# Patient Record
Sex: Female | Born: 1945 | Race: White | Hispanic: No | Marital: Married | Smoking: Never smoker
Health system: Southern US, Community
[De-identification: ages and names within clinical notes are randomized; demographics above are authoritative.]

## PROBLEM LIST (undated history)

## (undated) DIAGNOSIS — M1990 Unspecified osteoarthritis, unspecified site: Secondary | ICD-10-CM

## (undated) DIAGNOSIS — I639 Cerebral infarction, unspecified: Secondary | ICD-10-CM

## (undated) DIAGNOSIS — E7800 Pure hypercholesterolemia, unspecified: Secondary | ICD-10-CM

## (undated) DIAGNOSIS — G459 Transient cerebral ischemic attack, unspecified: Secondary | ICD-10-CM

## (undated) DIAGNOSIS — I00 Rheumatic fever without heart involvement: Secondary | ICD-10-CM

## (undated) HISTORY — PX: OTHER SURGICAL HISTORY: SHX169

## (undated) HISTORY — PX: ABDOMINAL HYSTERECTOMY: SHX81

---

## 2011-06-18 ENCOUNTER — Encounter (HOSPITAL_COMMUNITY): Payer: Self-pay | Admitting: *Deleted

## 2011-06-18 ENCOUNTER — Emergency Department (HOSPITAL_COMMUNITY)
Admission: EM | Admit: 2011-06-18 | Discharge: 2011-06-18 | Disposition: A | Discharge status: Home / Self Care | Payer: Medicare FFS | Source: Home / Self Care | Attending: Emergency Medicine | Admitting: Emergency Medicine

## 2011-06-18 DIAGNOSIS — G43509 Persistent migraine aura without cerebral infarction, not intractable, without status migrainosus: Secondary | ICD-10-CM

## 2011-06-18 HISTORY — DX: Transient cerebral ischemic attack, unspecified: G45.9

## 2011-06-18 HISTORY — DX: Unspecified osteoarthritis, unspecified site: M19.90

## 2011-06-18 HISTORY — DX: Pure hypercholesterolemia, unspecified: E78.00

## 2011-06-18 MED ORDER — HYDROCODONE-ACETAMINOPHEN 5-325 MG PO TABS
1.0000 | ORAL_TABLET | Freq: Once | ORAL | Status: AC
  Administered 2011-06-18: 1 via ORAL

## 2011-06-18 MED ORDER — ONDANSETRON 4 MG PO TBDP
ORAL_TABLET | ORAL | Status: AC
  Filled 2011-06-18: qty 1

## 2011-06-18 MED ORDER — HYDROCODONE-ACETAMINOPHEN 5-325 MG PO TABS
ORAL_TABLET | ORAL | Status: AC
  Filled 2011-06-18: qty 1

## 2011-06-18 MED ORDER — ONDANSETRON HCL 4 MG PO TABS: 4.0000 mg | ORAL_TABLET | Freq: Three times a day (TID) | ORAL | Status: DC | PRN

## 2011-06-18 MED ORDER — ONDANSETRON 4 MG PO TBDP
4.0000 mg | ORAL_TABLET | Freq: Once | ORAL | Status: AC
  Administered 2011-06-18: 4 mg via ORAL

## 2011-06-18 MED ORDER — HYDROCODONE-IBUPROFEN 7.5-200 MG PO TABS: 1.0000 | ORAL_TABLET | Freq: Four times a day (QID) | ORAL | Status: DC | PRN

## 2011-06-18 NOTE — ED Provider Notes (Addendum)
History     CSN: [id number]  Arrival date & time 06/18/11  1207   First MD Initiated Contact with Patient 06/18/11 1218      Chief Complaint  Patient presents with  . Migraine    (Consider location/radiation/quality/duration/timing/severity/associated sxs/prior treatment) HPI Comments: Patient presents to urgent care today she has been experiencing a migraine headache since Thursday. Describes has been having this "abnormal  eye sensation"  she tends to have visual auras " like an aluminum shaped structure on her right eye". She has tried ibuprofen for her pain. Patient describes episodes of migraine headaches usually don't last this long  photophobia and mild nausea. One episode of vomitting . Patient denies any further symptoms such as numbness, tingling, muscular weakness of any lower or upper extremities. No speech pattern changes or difficulties, no facial paralysis,. Spouse present during room did not comment on any further symptomatology as well or any changes.  Patient describes that at this point her headache seems to be getting better still has this unusual right eye sensation. She seems comfortable as she is describing history of her symptoms.  During exam and interview ask her if she has seen a neurologist in the past as she has had migraine headaches with other accompanied symptoms that are not typical. She answered "no".  Of note during the last segment of my interaction , Im informed  by a staff member says she is the mother of a nurse that works in our center.. Which she offered [place] more information as the patient has been having this migraine headaches more frequently and that as of 2 months ago she had an MRI of her brain done by her PCP Octavio Manns)  Patient is a 66 y.o. female presenting with migraine. The history is provided by the patient and the spouse.  Migraine This is a new problem. The current episode started more than 2 days ago. The problem occurs daily. The problem  has been gradually improving. Associated symptoms include headaches. Pertinent negatives include no chest pain and no shortness of breath. The symptoms are relieved by NSAIDs. The treatment provided mild relief.    Past Medical History  Diagnosis Date  . Migraine with aura   . Hypercholesteremia   . Arthritis   . TIA (transient ischemic attack)     Past Surgical History  Procedure Date  . Abdominal hysterectomy   . Bladder tacking     No family history on file.  History  Substance Use Topics  . Smoking status: Never Smoker   . Smokeless tobacco: Not on file  . Alcohol Use: No    OB History    Grav Para Term Preterm Abortions TAB SAB Ect Mult Living                  Review of Systems  Constitutional: Positive for activity change. Negative for fever, fatigue and unexpected weight change.  HENT: Negative for congestion, rhinorrhea, neck pain and neck stiffness.   Eyes: Positive for photophobia and visual disturbance. Negative for pain, discharge and itching.  Respiratory: Negative for shortness of breath.   Cardiovascular: Negative for chest pain and palpitations.  Neurological: Positive for headaches. Negative for dizziness, tremors, seizures, syncope, facial asymmetry, speech difficulty, weakness and numbness.    Allergies  Review of patient's allergies indicates no known allergies.  Home Medications   Current Outpatient Rx  Name Route Sig Dispense Refill  . ATORVASTATIN CALCIUM 20 MG PO TABS Oral Take 20 mg by mouth daily.    Marland Kitchen  MELOXICAM 15 MG PO TABS Oral Take 15 mg by mouth daily.    Marland Kitchen OMEPRAZOLE 20 MG PO CPDR Oral Take 20 mg by mouth daily.    Marland Kitchen HYDROCODONE-IBUPROFEN 7.5-200 MG PO TABS Oral Take 1 tablet by mouth every 6 (six) hours as needed for pain. 15 tablet 0  . ONDANSETRON HCL 4 MG PO TABS Oral Take 1 tablet (4 mg total) by mouth every 8 (eight) hours as needed for nausea. 15 tablet 0    BP 190/97  Pulse 67  Temp(Src) 98.4 F (36.9 C) (Oral)  Resp  17  SpO2 100%  Physical Exam  Nursing note and vitals reviewed. Constitutional: She is oriented to person, place, and time. She appears well-developed and well-nourished.  Non-toxic appearance. She does not have a sickly appearance. She does not appear ill. No distress.  HENT:  Head: Normocephalic.  Mouth/Throat: No oropharyngeal exudate.  Eyes: Conjunctivae and EOM are normal. Pupils are equal, round, and reactive to light. Right eye exhibits no chemosis and no discharge. Left eye exhibits no chemosis and no discharge. No scleral icterus.  Fundoscopic exam:      The right eye shows no hemorrhage.       The left eye shows no hemorrhage.    Neck: Neck supple. No JVD present.  Cardiovascular: Normal rate and regular rhythm.   Pulmonary/Chest: Effort normal. No respiratory distress.  Musculoskeletal: She exhibits no tenderness.  Lymphadenopathy:    She has no cervical adenopathy.  Neurological: She is alert and oriented to person, place, and time. She displays normal reflexes. No cranial nerve deficit. She exhibits normal muscle tone. Coordination normal.  Skin: No rash noted. No erythema.    ED Course  Procedures (including critical care time)  Labs Reviewed - No data to display No results found.   1. Migraine aura, persistent       MDM   Today's neurological exam was unremarkable on gross eye exam revealed no abnormalities as well. Suspect patient has complex type migraine with further information provided by relatively noted the patient is undergoing evaluation as her migraine headaches have become more frequent. Symptomatic management encouraged him to followup with ophthalmologist for a more comprehensive eye exam to rule out retinal conditions. Patient also was instructed about symptoms that should require further evaluation in the emergency department. Patient agreed with plan of care and followup care.       Jimmie Molly, MD 06/18/11 1737  Jimmie Molly, MD 06/20/11  [phone]

## 2011-06-18 NOTE — ED Notes (Addendum)
C/O migraine since Thurs.  Feels like typical migraine, but the duration is longer than usual.  Has tried 800mg  IBU without relief.  Has had intermittent nausea and sensitivity to light - none @ present time.  Typically has a visual aura preceding migraines "like crinkled aluminum foil in the shape of a letter C", but this time aura in right eye has remained and is a different shape than usual.  Feels her migraines have been occuring with increased frequency.  Had normal MRI approx 2 mo ago per daughter.

## 2011-06-20 ENCOUNTER — Encounter (HOSPITAL_COMMUNITY): Payer: Self-pay | Admitting: *Deleted

## 2011-06-20 ENCOUNTER — Emergency Department (HOSPITAL_COMMUNITY)
Admission: EM | Admit: 2011-06-20 | Discharge: 2011-06-20 | Disposition: A | Discharge status: Home / Self Care | Payer: Medicare FFS | Attending: Emergency Medicine | Admitting: Emergency Medicine

## 2011-06-20 DIAGNOSIS — R42 Dizziness and giddiness: Secondary | ICD-10-CM | POA: Insufficient documentation

## 2011-06-20 DIAGNOSIS — Z8673 Personal history of transient ischemic attack (TIA), and cerebral infarction without residual deficits: Secondary | ICD-10-CM | POA: Insufficient documentation

## 2011-06-20 DIAGNOSIS — G43909 Migraine, unspecified, not intractable, without status migrainosus: Secondary | ICD-10-CM | POA: Insufficient documentation

## 2011-06-20 DIAGNOSIS — E7800 Pure hypercholesterolemia, unspecified: Secondary | ICD-10-CM | POA: Insufficient documentation

## 2011-06-20 MED ORDER — METOCLOPRAMIDE HCL 5 MG/ML IJ SOLN
10.0000 mg | Freq: Once | INTRAMUSCULAR | Status: AC
  Administered 2011-06-20: 10 mg via INTRAVENOUS
  Filled 2011-06-20: qty 2

## 2011-06-20 MED ORDER — SODIUM CHLORIDE 0.9 % IV BOLUS (SEPSIS)
1000.0000 mL | Freq: Once | INTRAVENOUS | Status: AC
  Administered 2011-06-20: 1000 mL via INTRAVENOUS

## 2011-06-20 MED ORDER — DIPHENHYDRAMINE HCL 50 MG/ML IJ SOLN
12.5000 mg | Freq: Once | INTRAMUSCULAR | Status: AC
  Administered 2011-06-20: 12.5 mg via INTRAVENOUS
  Filled 2011-06-20: qty 1

## 2011-06-20 MED ORDER — DEXAMETHASONE 6 MG PO TABS
10.0000 mg | ORAL_TABLET | Freq: Once | ORAL | Status: AC
  Administered 2011-06-20: 10 mg via ORAL
  Filled 2011-06-20 (×2): qty 1

## 2011-06-20 MED ORDER — PROMETHAZINE HCL 25 MG/ML IJ SOLN
25.0000 mg | Freq: Once | INTRAMUSCULAR | Status: AC
  Administered 2011-06-20: 25 mg via INTRAVENOUS
  Filled 2011-06-20: qty 1

## 2011-06-20 NOTE — ED Provider Notes (Signed)
History     CSN: [id number]  Arrival date & time 06/20/11  1348   First MD Initiated Contact with Patient 06/20/11 1510      Chief Complaint  Patient presents with  . Migraine    (Consider location/radiation/quality/duration/timing/severity/associated sxs/prior treatment) Patient is a 66 y.o. female presenting with migraine. The history is provided by the patient.  Migraine This is a recurrent problem. Episode onset: 4 days ago. The problem occurs constantly. The problem has been waxing and waning. Associated symptoms include headaches, nausea and vomiting (once). Pertinent negatives include no abdominal pain, chest pain, chills, congestion, fever, rash or weakness. Exacerbated by: light, loud noises. Treatments tried: lortab. The treatment provided mild relief.    Past Medical History  Diagnosis Date  . Migraine with aura   . Hypercholesteremia   . Arthritis   . TIA (transient ischemic attack)     Past Surgical History  Procedure Date  . Abdominal hysterectomy   . Bladder tacking     History reviewed. No pertinent family history.  History  Substance Use Topics  . Smoking status: Never Smoker   . Smokeless tobacco: Not on file  . Alcohol Use: No    OB History    Grav Para Term Preterm Abortions TAB SAB Ect Mult Living                  Review of Systems  Constitutional: Negative for fever and chills.  HENT: Negative for congestion and rhinorrhea.   Respiratory: Negative for shortness of breath.   Cardiovascular: Negative for chest pain and leg swelling.  Gastrointestinal: Positive for nausea and vomiting (once). Negative for abdominal pain and constipation.  Genitourinary: Negative for urgency, decreased urine volume and difficulty urinating.  Skin: Negative for rash and wound.  Neurological: Positive for light-headedness and headaches. Negative for dizziness, syncope and weakness.  Psychiatric/Behavioral: Negative for confusion.  All other systems reviewed  and are negative.    Allergies  Review of patient's allergies indicates no known allergies.  Home Medications   Current Outpatient Rx  Name Route Sig Dispense Refill  . ATORVASTATIN CALCIUM 20 MG PO TABS Oral Take 20 mg by mouth daily.    Marland Kitchen HYDROCODONE-IBUPROFEN 7.5-200 MG PO TABS Oral Take 1 tablet by mouth every 6 (six) hours as needed. For pain    . MELOXICAM 15 MG PO TABS Oral Take 15 mg by mouth daily.    Marland Kitchen OMEPRAZOLE 20 MG PO CPDR Oral Take 20 mg by mouth daily.    Marland Kitchen ONDANSETRON HCL 4 MG PO TABS Oral Take 4 mg by mouth every 8 (eight) hours as needed. For nausea      BP 175/76  Pulse 63  Temp(Src) 97.6 F (36.4 C) (Oral)  Resp 20  SpO2 96%  Physical Exam  Nursing note and vitals reviewed. Constitutional: She is oriented to person, place, and time. She appears well-developed and well-nourished. No distress.  HENT:  Head: Normocephalic and atraumatic.  Right Ear: External ear normal.  Left Ear: External ear normal.  Nose: Nose normal.  Mouth/Throat: Oropharynx is clear and moist.  Eyes: EOM are normal. Pupils are equal, round, and reactive to light.  Neck: Neck supple.  Cardiovascular: Normal rate, regular rhythm, normal heart sounds and intact distal pulses.   Pulmonary/Chest: Effort normal and breath sounds normal. No respiratory distress. She has no wheezes. She has no rales.  Abdominal: Soft. She exhibits no distension. There is no tenderness.  Musculoskeletal: She exhibits no edema.  Lymphadenopathy:  She has no cervical adenopathy.  Neurological: She is alert and oriented to person, place, and time. She has normal strength. No cranial nerve deficit or sensory deficit. She exhibits normal muscle tone. Coordination and gait normal. GCS eye subscore is 4. GCS verbal subscore is 5. GCS motor subscore is 6.  Reflex Scores:      Tricep reflexes are 2+ on the right side and 2+ on the left side.      Bicep reflexes are 2+ on the right side and 2+ on the left side.       Brachioradialis reflexes are 2+ on the right side and 2+ on the left side.      Patellar reflexes are 2+ on the right side and 2+ on the left side.      Achilles reflexes are 2+ on the right side and 2+ on the left side. Skin: Skin is warm and dry. She is not diaphoretic. No pallor.    ED Course  Procedures (including critical care time)    1. Migraine headache       MDM  66 yo female with 4 days of migraine with aura (seeing aluminum foil out of right eye). Appears well, pain 6/10 on arrival. No atypical symptoms at this time to suggest imaging. Treated headache with reglan, benadryl, decadron and fluids (decreased po intake due to nausea). Pain improved but not enough, so also given phenergan. Pain then resolved completely, and patient felt ok to go home. With no new or different symptoms than her baseline migraines, do not feel imaging is indicated. No fevers or nuchal rigidity to suggest infectious cause. Discussed return precautions.         Pricilla Loveless, MD 06/21/11 [phone]

## 2011-06-20 NOTE — ED Notes (Signed)
Pt has had a migraine headache and nausea for a week now and hasn't been eating very well because of the nausea. Husband is with patient for support

## 2011-06-20 NOTE — ED Provider Notes (Addendum)
I saw and evaluated the patient, reviewed the resident's note and I agree with the findings and plan. 66 year old, female, with known history of migraine headaches complains of left-sided headache, with nausea, no vomiting.  She got photophobia, and scotomata in her left visual field.  She denies fever, neck pain, weakness, or rash.  On physical examination.  She is alert and oriented in no distress.  She got a normal neurological examination.  There is no indication for a CAT scan or lumbar puncture.  There is no indication for laboratory testing, either.  Cheri Guppy, MD 06/20/11 1757  I saw and evaluated the patient, reviewed the resident's note and I agree with the findings and plan.  Cheri Guppy, MD 06/21/11 [phone]

## 2011-06-20 NOTE — Discharge Instructions (Signed)
We discussed several things. Your  migraine seem to have a persistent aura. Have recommended you have a comprehensive funduscopic exam to rule out other ophthalmological conditions. I suspect your current symptoms are related to a complex migraine. Take these medicines  as prescribed and be alert for new symptoms. If worsening headache, any numbness, tingling or sudden weakness or facial changes should go immediately to the closest emergency department for further evaluation. Have also encouraged you to discuss a referral from your primary care Dr.  to see a neurologist as you are feeling the symptoms have becomed more frequent headaches.    Migraine Headache A migraine headache is an intense, throbbing pain on one or both sides of your head. The exact cause of a migraine headache is not always known. A migraine may be caused when nerves in the brain become irritated and release chemicals that cause swelling within blood vessels, causing pain. Many migraine sufferers have a family history of migraines. Before you get a migraine you may or may not get an aura. An aura is a group of symptoms that can predict the beginning of a migraine. An aura may include:  Visual changes such as:   Flashing lights.   Bright spots or zig-zag lines.   Tunnel vision.   Feelings of numbness.   Trouble talking.   Muscle weakness.  SYMPTOMS  Pain on one or both sides of your head.   Pain that is pulsating or throbbing in nature.   Pain that is severe enough to prevent daily activities.   Pain that is aggravated by any daily physical activity.   Nausea (feeling sick to your stomach), vomiting, or both.   Pain with exposure to bright lights, loud noises, or activity.   General sensitivity to bright lights or loud noises.  MIGRAINE TRIGGERS Examples of triggers of migraine headaches include:   Alcohol.   Smoking.   Stress.   It may be related to menses (female menstruation).   Aged cheeses.    Foods or drinks that contain nitrates, glutamate, aspartame, or tyramine.   Lack of sleep.   Chocolate.   Caffeine.   Hunger.   Medications such as nitroglycerine (used to treat chest pain), birth control pills, estrogen, and some blood pressure medications.  DIAGNOSIS  A migraine headache is often diagnosed based on:  Symptoms.   Physical examination.   A computerized X-ray scan (computed tomography, CT) of your head.  TREATMENT  Medications can help prevent migraines if they are recurrent or should they become recurrent. Your caregiver can help you with a medication or treatment program that will be helpful to you.   Lying down in a dark, quiet room may be helpful.   Keeping a headache diary may help you find a trend as to what may be triggering your headaches.  SEEK IMMEDIATE MEDICAL CARE IF:   You have confusion, personality changes or seizures.   You have headaches that wake you from sleep.   You have an increased frequency in your headaches.   You have a stiff neck.   You have a loss of vision.   You have muscle weakness.   You start losing your balance or have trouble walking.   You feel faint or pass out.  MAKE SURE YOU:   Understand these instructions.   Will watch your condition.   Will get help right away if you are not doing well or get worse.  Document Released: 02/28/2005 Document Revised: 02/17/2011 Document Reviewed: 10/14/2008 [employer]  Patient Information 2012 [employer]. 

## 2011-06-20 NOTE — ED Notes (Signed)
MD back at bedside.

## 2011-06-20 NOTE — Discharge Instructions (Signed)
Migraine Headache A migraine headache is an intense, throbbing pain on one or both sides of your head. The exact cause of a migraine headache is not always known. A migraine may be caused when nerves in the brain become irritated and release chemicals that cause swelling within blood vessels, causing pain. Many migraine sufferers have a family history of migraines. Before you get a migraine you may or may not get an aura. An aura is a group of symptoms that can predict the beginning of a migraine. An aura may include:  Visual changes such as:   Flashing lights.   Bright spots or zig-zag lines.   Tunnel vision.   Feelings of numbness.   Trouble talking.   Muscle weakness.  SYMPTOMS  Pain on one or both sides of your head.   Pain that is pulsating or throbbing in nature.   Pain that is severe enough to prevent daily activities.   Pain that is aggravated by any daily physical activity.   Nausea (feeling sick to your stomach), vomiting, or both.   Pain with exposure to bright lights, loud noises, or activity.   General sensitivity to bright lights or loud noises.  MIGRAINE TRIGGERS Examples of triggers of migraine headaches include:   Alcohol.   Smoking.   Stress.   It may be related to menses (female menstruation).   Aged cheeses.   Foods or drinks that contain nitrates, glutamate, aspartame, or tyramine.   Lack of sleep.   Chocolate.   Caffeine.   Hunger.   Medications such as nitroglycerine (used to treat chest pain), birth control pills, estrogen, and some blood pressure medications.  DIAGNOSIS  A migraine headache is often diagnosed based on:  Symptoms.   Physical examination.   A computerized X-ray scan (computed tomography, CT) of your head.  TREATMENT  Medications can help prevent migraines if they are recurrent or should they become recurrent. Your caregiver can help you with a medication or treatment program that will be helpful to you.   Lying  down in a dark, quiet room may be helpful.   Keeping a headache diary may help you find a trend as to what may be triggering your headaches.  SEEK IMMEDIATE MEDICAL CARE IF:   You have confusion, personality changes or seizures.   You have headaches that wake you from sleep.   You have an increased frequency in your headaches.   You have a stiff neck.   You have a loss of vision.   You have muscle weakness.   You start losing your balance or have trouble walking.   You feel faint or pass out.  MAKE SURE YOU:   Understand these instructions.   Will watch your condition.   Will get help right away if you are not doing well or get worse.  Document Released: 02/28/2005 Document Revised: 02/17/2011 Document Reviewed: 10/14/2008 [facility] Patient Information 2012 [place], [place].

## 2011-06-20 NOTE — ED Notes (Signed)
To ed for further eval of continued migraine HA. Seen last week for same at [facility]. Pain has continued. Pt c/o photophobia, weakness, and seeing spots. Nausea and vomiting this am.

## 2011-06-21 NOTE — ED Provider Notes (Signed)
I saw and evaluated the patient, reviewed the resident's note and I agree with the findings and plan.  Cheri Guppy, MD 06/21/11 [phone]

## 2011-07-01 ENCOUNTER — Inpatient Hospital Stay (HOSPITAL_COMMUNITY): Payer: Medicare FFS

## 2011-07-01 ENCOUNTER — Emergency Department (HOSPITAL_COMMUNITY): Payer: Medicare FFS

## 2011-07-01 ENCOUNTER — Inpatient Hospital Stay (HOSPITAL_COMMUNITY)
Admission: EM | Admit: 2011-07-01 | Discharge: 2011-07-02 | DRG: 066 | Disposition: A | Discharge status: Home / Self Care | Payer: Medicare FFS | Attending: Internal Medicine | Admitting: Internal Medicine

## 2011-07-01 ENCOUNTER — Encounter (HOSPITAL_COMMUNITY): Payer: Self-pay | Admitting: *Deleted

## 2011-07-01 DIAGNOSIS — R030 Elevated blood-pressure reading, without diagnosis of hypertension: Secondary | ICD-10-CM | POA: Diagnosis present

## 2011-07-01 DIAGNOSIS — R209 Unspecified disturbances of skin sensation: Secondary | ICD-10-CM | POA: Diagnosis present

## 2011-07-01 DIAGNOSIS — M129 Arthropathy, unspecified: Secondary | ICD-10-CM | POA: Diagnosis present

## 2011-07-01 DIAGNOSIS — Z79899 Other long term (current) drug therapy: Secondary | ICD-10-CM

## 2011-07-01 DIAGNOSIS — Z7982 Long term (current) use of aspirin: Secondary | ICD-10-CM

## 2011-07-01 DIAGNOSIS — R200 Anesthesia of skin: Secondary | ICD-10-CM | POA: Diagnosis present

## 2011-07-01 DIAGNOSIS — G43109 Migraine with aura, not intractable, without status migrainosus: Secondary | ICD-10-CM | POA: Diagnosis present

## 2011-07-01 DIAGNOSIS — I498 Other specified cardiac arrhythmias: Secondary | ICD-10-CM | POA: Diagnosis present

## 2011-07-01 DIAGNOSIS — I6330 Cerebral infarction due to thrombosis of unspecified cerebral artery: Principal | ICD-10-CM | POA: Diagnosis present

## 2011-07-01 DIAGNOSIS — I639 Cerebral infarction, unspecified: Secondary | ICD-10-CM | POA: Diagnosis present

## 2011-07-01 DIAGNOSIS — G43909 Migraine, unspecified, not intractable, without status migrainosus: Secondary | ICD-10-CM | POA: Diagnosis present

## 2011-07-01 DIAGNOSIS — E785 Hyperlipidemia, unspecified: Secondary | ICD-10-CM | POA: Diagnosis present

## 2011-07-01 DIAGNOSIS — Z8673 Personal history of transient ischemic attack (TIA), and cerebral infarction without residual deficits: Secondary | ICD-10-CM

## 2011-07-01 DIAGNOSIS — R001 Bradycardia, unspecified: Secondary | ICD-10-CM | POA: Diagnosis not present

## 2011-07-01 HISTORY — DX: Rheumatic fever without heart involvement: I00

## 2011-07-01 LAB — BASIC METABOLIC PANEL
CO2: 27 mEq/L (ref 19–32)
Chloride: 104 mEq/L (ref 96–112)
Glucose, Bld: 111 mg/dL — ABNORMAL HIGH (ref 70–99)
Potassium: 3.6 mEq/L (ref 3.5–5.1)
Sodium: 142 mEq/L (ref 135–145)

## 2011-07-01 LAB — CBC
HCT: 43.4 % (ref 36.0–46.0)
Hemoglobin: 14.9 g/dL (ref 12.0–15.0)
MCH: 29.7 pg (ref 26.0–34.0)
MCHC: 34.3 g/dL (ref 30.0–36.0)
MCV: 86.6 fL (ref 78.0–100.0)
Platelets: 262 10*3/uL (ref 150–400)
RBC: 5.01 MIL/uL (ref 3.87–5.11)
RDW: 12.4 % (ref 11.5–15.5)
WBC: 6.4 10*3/uL (ref 4.0–10.5)

## 2011-07-01 LAB — DIFFERENTIAL
Lymphocytes Relative: 27 % (ref 12–46)
Lymphs Abs: 1.7 10*3/uL (ref 0.7–4.0)
Monocytes Relative: 5 % (ref 3–12)
Neutrophils Relative %: 66 % (ref 43–77)

## 2011-07-01 LAB — CARDIAC PANEL(CRET KIN+CKTOT+MB+TROPI): Troponin I: <0.3 ng/mL (ref <0.30)

## 2011-07-01 LAB — TSH: TSH: 3.454 u[IU]/mL (ref 0.350–4.500)

## 2011-07-01 LAB — T4, FREE: Free T4: 1.17 ng/dL (ref 0.80–1.80)

## 2011-07-01 MED ORDER — MELOXICAM 7.5 MG PO TABS
15.0000 mg | ORAL_TABLET | Freq: Every day | ORAL | Status: DC
  Filled 2011-07-01 (×3): qty 1

## 2011-07-01 MED ORDER — ONDANSETRON HCL 4 MG/2ML IJ SOLN: 4.0000 mg | Freq: Four times a day (QID) | INTRAMUSCULAR | Status: DC | PRN

## 2011-07-01 MED ORDER — ENOXAPARIN SODIUM 40 MG/0.4ML ~~LOC~~ SOLN
40.0000 mg | SUBCUTANEOUS | Status: DC
  Administered 2011-07-01: 40 mg via SUBCUTANEOUS
  Filled 2011-07-01: qty 0.4

## 2011-07-01 MED ORDER — OXYCODONE-ACETAMINOPHEN 5-325 MG PO TABS
1.0000 | ORAL_TABLET | Freq: Once | ORAL | Status: AC
  Administered 2011-07-01: 1 via ORAL
  Filled 2011-07-01: qty 1

## 2011-07-01 MED ORDER — ASPIRIN 81 MG PO CHEW
324.0000 mg | CHEWABLE_TABLET | Freq: Once | ORAL | Status: AC
  Administered 2011-07-01: 324 mg via ORAL
  Filled 2011-07-01: qty 4

## 2011-07-01 MED ORDER — FOLIC ACID 400 MCG PO TABS
400.0000 ug | ORAL_TABLET | Freq: Every day | ORAL | Status: DC
Start: 2011-07-01 — End: 2011-07-01

## 2011-07-01 MED ORDER — SODIUM CHLORIDE 0.9 % IJ SOLN
3.0000 mL | Freq: Two times a day (BID) | INTRAMUSCULAR | Status: DC
  Administered 2011-07-01: 3 mL via INTRAVENOUS

## 2011-07-01 MED ORDER — PANTOPRAZOLE SODIUM 40 MG PO TBEC
40.0000 mg | DELAYED_RELEASE_TABLET | Freq: Every day | ORAL | Status: DC
  Administered 2011-07-02: 40 mg via ORAL
  Filled 2011-07-01: qty 1

## 2011-07-01 MED ORDER — SENNOSIDES-DOCUSATE SODIUM 8.6-50 MG PO TABS: 1.0000 | ORAL_TABLET | Freq: Every evening | ORAL | Status: DC | PRN

## 2011-07-01 MED ORDER — ATORVASTATIN CALCIUM 20 MG PO TABS
20.0000 mg | ORAL_TABLET | Freq: Every day | ORAL | Status: DC
  Administered 2011-07-01: 20 mg via ORAL
  Filled 2011-07-01: qty 1

## 2011-07-01 MED ORDER — FOLIC ACID 1 MG PO TABS
1.0000 mg | ORAL_TABLET | Freq: Every day | ORAL | Status: DC
  Administered 2011-07-02: 1 mg via ORAL
  Filled 2011-07-01: qty 1

## 2011-07-01 MED ORDER — OMEGA-3-ACID ETHYL ESTERS 1 G PO CAPS
2.0000 g | ORAL_CAPSULE | Freq: Every day | ORAL | Status: DC
  Administered 2011-07-02: 2 g via ORAL
  Filled 2011-07-01: qty 2

## 2011-07-01 MED ORDER — OXYCODONE HCL 5 MG PO TABS: 5.0000 mg | ORAL_TABLET | ORAL | Status: DC | PRN

## 2011-07-01 MED ORDER — CLOPIDOGREL BISULFATE 75 MG PO TABS
75.0000 mg | ORAL_TABLET | Freq: Every day | ORAL | Status: DC
  Administered 2011-07-02: 75 mg via ORAL
  Filled 2011-07-01: qty 1

## 2011-07-01 MED ORDER — SIMVASTATIN 20 MG PO TABS: 40.0000 mg | ORAL_TABLET | Freq: Every day | ORAL | Status: DC

## 2011-07-01 MED ORDER — SODIUM CHLORIDE 0.9 % IJ SOLN
INTRAMUSCULAR | Status: AC
  Filled 2011-07-01: qty 3

## 2011-07-01 MED ORDER — LORAZEPAM 1 MG PO TABS
1.0000 mg | ORAL_TABLET | Freq: Once | ORAL | Status: AC
  Administered 2011-07-01: 1 mg via ORAL
  Filled 2011-07-01: qty 1

## 2011-07-01 NOTE — ED Provider Notes (Signed)
History   This chart was scribed for Lauren Gaskins, MD by Cherlynn Perches. The patient was seen in room APA14/APA14. Patient's care was started at 1121.      CSN: [id number]  Arrival date & time 07/01/11  1121   First MD Initiated Contact with Patient 07/01/11 1129      Chief Complaint  Patient presents with  . Code Stroke     HPI  Lauren Sanders is a 66 y.o. female with a h/o a TIA who presents to the Emergency Department complaining of one hour of sudden onset numbness localized to the left arm and mouth with associated weakness, mild confusion, and lightheadedness. Pt states that she has experienced relief from symptoms since arriving to the ED. Pt also reports that she has been suffering from moderate migraine headaches with aura for about 2 weeks. Pt is supposed to see Dr. Anne Hahn at [facility] in 4 days for an MRI. Pt reports that she had a TIA last November and had an MRI at the same time, but has not had any CT scans. Pt also reports that everything she eats or drinks has tasted bitter "for a few weeks." Pt denies cough, SOB, chest pain, abdominal pain, and syncope. Pt currently takes a low dose ASA daily, but does not take any other blood thinners. Pt denies smoking and alcohol use. Pt's PCP is in [place].  Nothing improves symptoms Nothing worsens symptom   Past Medical History  Diagnosis Date  . Migraine with aura   . Hypercholesteremia   . Arthritis   . TIA (transient ischemic attack)     Past Surgical History  Procedure Date  . Abdominal hysterectomy   . Bladder tacking     No family history on file.  History  Substance Use Topics  . Smoking status: Never Smoker   . Smokeless tobacco: Not on file  . Alcohol Use: No    OB History    Grav Para Term Preterm Abortions TAB SAB Ect Mult Living                  Review of Systems A complete 10 system review of systems was obtained and all systems are negative except as noted in the HPI and  PMH.    Allergies  Review of patient's allergies indicates no known allergies.  Home Medications   Current Outpatient Rx  Name Route Sig Dispense Refill  . ATORVASTATIN CALCIUM 20 MG PO TABS Oral Take 20 mg by mouth daily.    Marland Kitchen HYDROCODONE-IBUPROFEN 7.5-200 MG PO TABS Oral Take 1 tablet by mouth every 6 (six) hours as needed. For pain    . MELOXICAM 15 MG PO TABS Oral Take 15 mg by mouth daily.    Marland Kitchen OMEPRAZOLE 20 MG PO CPDR Oral Take 20 mg by mouth daily.    Marland Kitchen ONDANSETRON HCL 4 MG PO TABS Oral Take 4 mg by mouth every 8 (eight) hours as needed. For nausea      BP 174/83  Temp(Src) 98.1 F (36.7 C) (Oral)  Resp 18  Ht 5\' 2"  (1.575 m)  Wt 115 lb (52.164 kg)  BMI 21.03 kg/m2  SpO2 99% BP 174/83  Temp(Src) 98.1 F (36.7 C) (Oral)  Resp 18  Ht 5\' 2"  (1.575 m)  Wt 115 lb (52.164 kg)  BMI 21.03 kg/m2  SpO2 99%  Physical Exam CONSTITUTIONAL: Well developed/well nourished HEAD AND FACE: Normocephalic/atraumatic EYES: EOMI/PERRL ENMT: Mucous membranes moist NECK: supple no meningeal signs, no bruits  SPINE:entire spine nontender CV: S1/S2 noted, no murmurs/rubs/gallops noted LUNGS: Lungs are clear to auscultation bilaterally, no apparent distress ABDOMEN: soft, nontender, no rebound or guarding GU:no cva tenderness NEURO:  NIHSS:0, no nystagmus, no past pointing. Awake/alert, facies symmetric, no arm or leg drift is noted Cranial nerves 3/4/5/6/09/19/08/11/12 tested and intact No visual field deficit EXTREMITIES: pulses normal, full ROM SKIN: warm, color normal PSYCH: no abnormalities of mood noted   ED Course  Procedures  DIAGNOSTIC STUDIES: Oxygen Saturation is 99% on room air, normal by my interpretation.    COORDINATION OF CARE: 11:40AM - Will call Dr. Anne Hahn about doing MRI now. Patient and Family understand and agree with initial ED impression and plan with expectations set for ED visit.  tPA in stroke considered but not given due to: Symptoms resolved  1:39  PM Spoke to dr Anne Hahn, her neurologist Had outpatient mri ordered for likely complicated migraine This can be done today.  If negative, can be discharged with followup and continue daily ASA Does not feel she needs TIA workup after MRI  12:43PM - spoke to pt regarding plan for MRI 2:01 PM D/w radiology concerning MRI Will call her neurologist ASA ordered Pt stable, ambulatory Pt reports HA but no new weakness  2:14PM - consult with neurologist, recommend admit  2:47 PM D/w dr Sherrie Mustache, will admit here to [place] Pt updated   MDM  Nursing notes reviewed and considered in documentation All labs/vitals reviewed and considered Previous records reviewed and considered    Date: 07/01/2011  Rate: 73  Rhythm: normal sinus rhythm  QRS Axis: normal  Intervals: normal  ST/T Wave abnormalities: nonspecific ST changes  Conduction Disutrbances:none      I personally performed the services described in this documentation, which was scribed in my presence. The recorded information has been reviewed and considered.       Lauren Gaskins, MD 07/01/11 1447

## 2011-07-01 NOTE — ED Notes (Signed)
Pt presents to er with c/o sudden onset of left arm weakness and mouth feeling numb that occurred around 10:30 this am, on arrival to er pt alert, able to answer questions,  Grips equal bilateral, speech clear, pt states that she has been having problems with headaches for the past two weeks or so.

## 2011-07-01 NOTE — H&P (Signed)
Lauren Sanders MRN: [id number] DOB/AGE: 08-19-1945 66 y.o. Primary Care Physician:Pcp Not In System Admit date: 07/01/2011 Chief Complaint: Left-sided numbness. HPI: The patient is a 66 year old woman with a past medical history significant for migraine headaches with aura, previous TIA, and hyperlipidemia, who presents to the emergency department today with a complaint of left facial numbness and left arm numbness. The numbness started this morning at approximately 10:30 AM. She also had transient slurred speech according to her husband and son. She had an unsteady gait this morning. She currently denies unilateral weakness, but does have sensation of left-sided numbness still. She denies chest pain, palpitations, shortness of breath, vomiting, abdominal pain, pain with urination, swelling in her legs or feet, fever, or chills. She has also had both left-sided and right-sided migraine headaches over the past couple weeks that have worsened. She was seen at the [facility] urgent care and then the [facility] emergency department for treatment of migraine headaches that were associated with a right-sided aura, blurred vision, and nausea. She also had transient blindness in both eyes. She was treated with Zofran and hydrocodone. She was advised to see an ophthalmologist or optometrist. She did and following the visual examination, she was told that there were no abnormalities. She was subsequently referred to neurologist Dr. Anne Hahn. He prescribed a medication for migraine headaches which she took. It did help to relieve her headache but it caused some nausea. The plan was for Dr. Anne Hahn to schedule an outpatient MRI of her brain within the next week or 2 according to the family.  In the emergency department, the patient is noted to be hypertensive with a blood pressure ranging in the 160s to 180s systolically. She is otherwise afebrile and hemodynamically stable. An MRI of her brain was ordered in the emergency  department, and it revealed 2 small areas of infarction on the left. She is being admitted for further evaluation and management.  Past Medical History  Diagnosis Date  . Migraine with aura   . Hypercholesteremia   . Arthritis   . TIA (transient ischemic attack)   . Rheumatic fever     As a child.    Past Surgical History  Procedure Date  . Abdominal hysterectomy   . Bladder tacking     Prior to Admission medications   Medication Sig Start Date End Date Taking? Authorizing Provider  aspirin 81 MG tablet Take 81 mg by mouth daily.   Yes Historical Provider, MD  atorvastatin (LIPITOR) 20 MG tablet Take 20 mg by mouth daily.   Yes Historical Provider, MD  Diclofenac Potassium (CAMBIA) 50 MG PACK Take 1 each by mouth daily as needed. Pain   Yes Historical Provider, MD  fish oil-omega-3 fatty acids 1000 MG capsule Take 2 g by mouth daily.   Yes Historical Provider, MD  folic acid (FOLVITE) 400 MCG tablet Take 400 mcg by mouth daily.   Yes Historical Provider, MD  ibuprofen (ADVIL,MOTRIN) 200 MG tablet Take 400 mg by mouth every 6 (six) hours as needed. Pain   Yes Historical Provider, MD  meloxicam (MOBIC) 15 MG tablet Take 15 mg by mouth daily.   Yes Historical Provider, MD  omeprazole (PRILOSEC) 20 MG capsule Take 20 mg by mouth daily.   Yes Historical Provider, MD  ondansetron (ZOFRAN) 4 MG tablet Take 4 mg by mouth every 8 (eight) hours as needed. For nausea   Yes Jimmie Molly, MD  Vitamin D, Ergocalciferol, (DRISDOL) 50000 UNITS CAPS Take 50,000 Units by mouth  daily.   Yes Historical Provider, MD    Allergies: No Known Allergies  No family history on file.  Social History: She is retired. She is married. She has 3 children. She denies tobacco, alcohol, and illicit drug use.       ROS: As above in history present illness, otherwise review of systems is negative.  PHYSICAL EXAM: Blood pressure 164/83, pulse 65, temperature 98.2 F (36.8 C), temperature source Oral, resp.  rate 18, height 5\' 2"  (1.575 m), weight 52.164 kg (115 lb), SpO2 95.00%.  General: Very pleasant 66 year old Caucasian woman, laying in bed, in no acute distress. HEENT: Head is normocephalic, nontraumatic. Pupils are equal, round, and reactive to light. Extraocular moves are intact. Conjunctivae are clear. Sclerae are white. Oropharynx reveals moist mucous membranes. No posterior exudates or erythema.  Neck: Supple, no adenopathy, no thyromegaly, no JVD. There is a radiating murmur bilaterally. Lungs: Clear to auscultation bilaterally. Heart: S1, S2, with a 1-2/6 systolic murmur. No ectopy. Abdomen: Positive bowel sounds, soft, nontender, nondistended. Extremities: No pedal edema. Pedal pulses palpable. Neurologic: She is alert and oriented x3. Cranial nerve II through XII intact. Grossly, sensation is symmetric and intact bilaterally. No evidence of facial droop or dysarthria or aphasia. Negative pronator drift. Cerebellar with finger-to-nose intact bilaterally. Strength is 5 over 5 throughout and symmetric in the supine position. Plantar reflexes mildly upgoing bilaterally.  Basic Metabolic Panel:  Basename 07/01/11 1141  NA 142  K 3.6  CL 104  CO2 27  GLUCOSE 111*  BUN 10  CREATININE 1.01  CALCIUM 10.5  MG --  PHOS --   Liver Function Tests: No results found for this basename: AST:2,ALT:2,ALKPHOS:2,BILITOT:2,PROT:2,ALBUMIN:2 in the last 72 hours No results found for this basename: LIPASE:2,AMYLASE:2 in the last 72 hours No results found for this basename: AMMONIA:2 in the last 72 hours CBC:  Basename 07/01/11 1141  WBC 6.4  NEUTROABS 4.2  HGB 14.9  HCT 43.4  MCV 86.6  PLT 262   Cardiac Enzymes: No results found for this basename: CKTOTAL:3,CKMB:3,CKMBINDEX:3,TROPONINI:3 in the last 72 hours BNP: No results found for this basename: PROBNP:3 in the last 72 hours D-Dimer: No results found for this basename: DDIMER:2 in the last 72 hours CBG: No results found for this  basename: GLUCAP:6 in the last 72 hours Hemoglobin A1C: No results found for this basename: HGBA1C in the last 72 hours Fasting Lipid Panel: No results found for this basename: CHOL,HDL,LDLCALC,TRIG,CHOLHDL,LDLDIRECT in the last 72 hours Thyroid Function Tests: No results found for this basename: TSH,T4TOTAL,FREET4,T3FREE,THYROIDAB in the last 72 hours Anemia Panel: No results found for this basename: VITAMINB12,FOLATE,FERRITIN,TIBC,IRON,RETICCTPCT in the last 72 hours Coagulation: No results found for this basename: LABPROT:2,INR:2 in the last 72 hours Urine Drug Screen: Drugs of Abuse  No results found for this basename: labopia,  cocainscrnur,  labbenz,  amphetmu,  thcu,  labbarb    Alcohol Level: No results found for this basename: ETH:2 in the last 72 hours Urinalysis: No results found for this basename: COLORURINE:2,APPERANCEUR:2,LABSPEC:2,PHURINE:2,GLUCOSEU:2,HGBUR:2,BILIRUBINUR:2,KETONESUR:2,PROTEINUR:2,UROBILINOGEN:2,NITRITE:2,LEUKOCYTESUR:2 in the last 72 hours Misc. Labs:   EKG: Normal sinus rhythm. Heart rate 73 beats per minute. Nonspecific ST/T-wave abnormalities.  No results found for this or any previous visit (from the past 240 hour(s)).   Results for orders placed during the hospital encounter of 07/01/11 (from the past 48 hour(s))  CBC     Status: Normal   Collection Time   07/01/11 11:41 AM      Component Value Range Comment   WBC 6.4  4.0 - 10.5 (K/uL)    RBC 5.01  3.87 - 5.11 (MIL/uL)    Hemoglobin 14.9  12.0 - 15.0 (g/dL)    HCT 16.1  09.6 - 04.5 (%)    MCV 86.6  78.0 - 100.0 (fL)    MCH 29.7  26.0 - 34.0 (pg)    MCHC 34.3  30.0 - 36.0 (g/dL)    RDW 40.9  81.1 - 91.4 (%)    Platelets 262  150 - 400 (K/uL)   DIFFERENTIAL     Status: Normal   Collection Time   07/01/11 11:41 AM      Component Value Range Comment   Neutrophils Relative 66  43 - 77 (%)    Neutro Abs 4.2  1.7 - 7.7 (K/uL)    Lymphocytes Relative 27  12 - 46 (%)    Lymphs Abs 1.7  0.7 -  4.0 (K/uL)    Monocytes Relative 5  3 - 12 (%)    Monocytes Absolute 0.3  0.1 - 1.0 (K/uL)    Eosinophils Relative 2  0 - 5 (%)    Eosinophils Absolute 0.1  0.0 - 0.7 (K/uL)    Basophils Relative 1  0 - 1 (%)    Basophils Absolute 0.0  0.0 - 0.1 (K/uL)   BASIC METABOLIC PANEL     Status: Abnormal   Collection Time   07/01/11 11:41 AM      Component Value Range Comment   Sodium 142  135 - 145 (mEq/L)    Potassium 3.6  3.5 - 5.1 (mEq/L)    Chloride 104  96 - 112 (mEq/L)    CO2 27  19 - 32 (mEq/L)    Glucose, Bld 111 (*) 70 - 99 (mg/dL)    BUN 10  6 - 23 (mg/dL)    Creatinine, Ser 7.82  0.50 - 1.10 (mg/dL)    Calcium 95.6  8.4 - 10.5 (mg/dL)    GFR calc non Af Amer 56 (*) >90 (mL/min)    GFR calc Af Amer 65 (*) >90 (mL/min)     Mr Brain Wo Contrast  07/01/2011  *RADIOLOGY REPORT*  Clinical Data: Sudden onset numbness localized to left arm, now with slight associated weakness and confusion symptoms are now improved. History of headaches, clinically migraine.  MRI HEAD WITHOUT CONTRAST  Technique:  Multiplanar, multiecho pulse sequences of the brain and surrounding structures were obtained according to standard protocol without intravenous contrast.  Comparison: None.  Findings: There are two subcentimeter foci of restricted diffusion in the left hemisphere consistent with acute infarction.  A 5 mm focus in the left lateral thalamus ( series 3 image 10) could result in right-sided sensory symptoms.  Similarly, a 5 mm focus in the left parietal subcortical white matter ( series 3 image 14) lies directly under the post central sulcus. These appeared represent areas of acute infarction.  Complicated migraine would be an unlikely secondary consideration.  No large vessel cortical infarct is seen to suggest vasculitis.  There is no hemorrhage, mass lesion, hydrocephalus, or extra-axial fluid.  Mild atrophy is appropriate for the patient's age of 53.  There is mild to moderate chronic microvascular  ischemic change in the periventricular and subcortical white matter. No midline shift. Normal pituitary and cerebellar tonsils.  Upper cervical region unremarkable.  Major intracranial vascular structures patent. Negative orbits.  Chronic sinus disease.  No mastoid fluid.  IMPRESSION: There are two small areas of restricted diffusion, one in the left thalamus, the other  in the left parietal subcortical white matter which could explain the patient's recent right hemisensory symptoms.  These are likely to represent cerebrovascular disease rather than complicated migraine.  Mild atrophy and chronic microvascular ischemic change.  No hemorrhage or mass lesion.  Original Report Authenticated By: Elsie Stain, M.D.   US Carotid Duplex Bilateral  07/01/2011  *RADIOLOGY REPORT*  Clinical Data: Stroke, TIA, history hypertension  BILATERAL CAROTID DUPLEX ULTRASOUND  Technique: Wallace Cullens scale imaging, color Doppler and duplex ultrasound was performed of bilateral carotid and vertebral arteries in the neck.  Comparison:  None  Criteria:  Quantification of carotid stenosis is based on velocity parameters that correlate the residual internal carotid diameter with NASCET-based stenosis levels, using the diameter of the distal internal carotid lumen as the denominator for stenosis measurement.  The following velocity measurements were obtained:                   PEAK SYSTOLIC/END DIASTOLIC RIGHT ICA:                        77/26cm/sec CCA:                        75/20cm/sec SYSTOLIC ICA/CCA RATIO:     1.02 DIASTOLIC ICA/CCA RATIO:    1.28 ECA:                        85cm/sec  LEFT ICA:                        81/31cm/sec CCA:                        66/15cm/sec SYSTOLIC ICA/CCA RATIO:     1.23 DIASTOLIC ICA/CCA RATIO:    2.01 ECA:                        85cm/sec  Findings:  RIGHT CAROTID ARTERY: Intimal thickening right CCA and right carotid bulb, less at right ICA and right ECA.  Tortuous right ICA with associated turbulent flow on  color Doppler imaging and spectral broadening on waveform analysis.  No significant plaque formation identified.  No high velocity jets.  RIGHT VERTEBRAL ARTERY:  Patent, antegrade  LEFT CAROTID ARTERY: Mild intimal thickening left CCA and left carotid bulb.  Minimal noncalcified plaque left carotid bulb. Mildly tortuous left carotid system with associated turbulent flow on color Doppler imaging and spectral broadening on waveform analysis.  No high velocity jets.  LEFT VERTEBRAL ARTERY:  Patent, antegrade  IMPRESSION: Tortuous carotid systems. No evidence of significant plaque formation or hemodynamically significant stenosis.  Original Report Authenticated By: Lollie Marrow, M.D.    Impression:  Principal Problem:  *Left sided numbness Active Problems:  Stroke, acute, thrombotic  Elevated blood pressure  Migraine headache  Hyperlipidemia    1. Acute left brain stroke with left-sided numbness/paresthesias. This is likely an aspirin failure.  2. History of migraine headaches with aura.  3. Elevated blood pressure. She has no history of hypertension however, over the past several weeks, she has been treated with several NSAIDs which could be contributing to her elevated blood pressures.  4. History of hyperlipidemia.    Plan:  1. The patient was given 325 mg of aspirin in the emergency department. Since she had an acute stroke on aspirin therapy, it will be  discontinued in favor of Plavix. 2. A carotid Doppler has already been ordered and the results revealed no significant ICA stenosis. A 2-D echocardiogram was ordered, but apparently, he was not done yet. A decision will be made to either keep the patient in the hospital over the weekend until Monday for the 2-D echocardiogram or to try to arrange it as an outpatient. 3. For further evaluation, we'll order TSH, free T4, fasting lipid profile, hemoglobin A1c, etc. 4. We'll treat the patient's migraine headaches with as needed oxycodone.  We'll discontinue all NSAIDs with the exception of Mobic and Plavix. 5. PT/OT consults. 6. We'll monitor the patient's blood pressure. We'll start antihypertensive medication is her blood pressure is consistently elevated over the next 24-48 hours.      Lauren Sanders 07/01/2011, 6:16 PM

## 2011-07-02 DIAGNOSIS — R001 Bradycardia, unspecified: Secondary | ICD-10-CM | POA: Diagnosis not present

## 2011-07-02 LAB — COMPREHENSIVE METABOLIC PANEL
ALT: 11 U/L (ref 0–35)
Albumin: 3.7 g/dL (ref 3.5–5.2)
Alkaline Phosphatase: 66 U/L (ref 39–117)
BUN: 12 mg/dL (ref 6–23)
CO2: 27 mEq/L (ref 19–32)
GFR calc Af Amer: 58 mL/min — ABNORMAL LOW (ref >90)
Glucose, Bld: 97 mg/dL (ref 70–99)
Total Bilirubin: 0.9 mg/dL (ref 0.3–1.2)
Total Protein: 6.4 g/dL (ref 6.0–8.3)

## 2011-07-02 LAB — LIPID PANEL
Cholesterol: 139 mg/dL (ref 0–200)
HDL: 42 mg/dL (ref >39)
Total CHOL/HDL Ratio: 3.3 RATIO
Triglycerides: 151 mg/dL — ABNORMAL HIGH (ref <150)
VLDL: 30 mg/dL (ref 0–40)

## 2011-07-02 LAB — CARDIAC PANEL(CRET KIN+CKTOT+MB+TROPI)
CK, MB: 1.3 ng/mL (ref 0.3–4.0)
Relative Index: INVALID (ref 0.0–2.5)
Troponin I: <0.3 ng/mL (ref <0.30)
Troponin I: <0.3 ng/mL (ref <0.30)

## 2011-07-02 LAB — CBC
HCT: 39.8 % (ref 36.0–46.0)
MCHC: 33.7 g/dL (ref 30.0–36.0)
MCV: 88.2 fL (ref 78.0–100.0)
Platelets: 255 10*3/uL (ref 150–400)
RDW: 12.6 % (ref 11.5–15.5)
WBC: 6 10*3/uL (ref 4.0–10.5)

## 2011-07-02 MED ORDER — VITAMIN D (ERGOCALCIFEROL) 1.25 MG (50000 UNIT) PO CAPS: 50000.0000 [IU] | ORAL_CAPSULE | ORAL | Status: DC

## 2011-07-02 MED ORDER — CLOPIDOGREL BISULFATE 75 MG PO TABS: 75.0000 mg | ORAL_TABLET | Freq: Every day | ORAL | Status: AC

## 2011-07-02 MED ORDER — OXYCODONE-ACETAMINOPHEN 5-325 MG PO TABS: 1.0000 | ORAL_TABLET | ORAL | Status: AC | PRN

## 2011-07-02 NOTE — Discharge Summary (Signed)
Physician Discharge Summary  Lauren Sanders MRN: [id number] DOB/AGE: 68-May-1947 68 y.o.  PCP: Pcp Not In System   Admit date: 07/01/2011 Discharge date: 07/02/2011  Discharge Diagnoses:  1. Acute left brain stroke. Aspirin failure. 2. Left-sided paresthesias/numbness secondary to the acute stroke. Resolving. 3. Migraine headaches. 4. Elevated blood pressure on admission, resolved at the time of discharge. 5. Transient bradycardia. Thyroid function was within normal limits. 6. Hyperlipidemia. The patient's fasting lipid profile revealed total cholesterol 139, triglycerides 151, HDL cholesterol of 42, and LDL cholesterol 67.   Medication List  As of 07/02/2011 10:12 AM   STOP taking these medications         aspirin 81 MG tablet      ibuprofen 200 MG tablet         TAKE these medications         atorvastatin 20 MG tablet   Commonly known as: LIPITOR   Take 20 mg by mouth daily.      CAMBIA 50 MG Pack   Generic drug: Diclofenac Potassium   Take 1 each by mouth daily as needed. Pain      clopidogrel 75 MG tablet   Commonly known as: PLAVIX   Take 1 tablet (75 mg total) by mouth daily with breakfast.      fish oil-omega-3 fatty acids 1000 MG capsule   Take 2 g by mouth daily.      folic acid 400 MCG tablet   Commonly known as: FOLVITE   Take 400 mcg by mouth daily.      meloxicam 15 MG tablet   Commonly known as: MOBIC   Take 15 mg by mouth daily.      omeprazole 20 MG capsule   Commonly known as: PRILOSEC   Take 20 mg by mouth daily.      ondansetron 4 MG tablet   Commonly known as: ZOFRAN   Take 4 mg by mouth every 8 (eight) hours as needed. For nausea      oxyCODONE-acetaminophen 5-325 MG per tablet   Commonly known as: PERCOCET   Take 1 tablet by mouth every 4 (four) hours as needed for pain.      Vitamin D (Ergocalciferol) 50000 UNITS Caps   Commonly known as: DRISDOL   Take 1 capsule (50,000 Units total) by mouth every 7 (seven) days.            Discharge Condition: Improved.  Disposition: 01-Home or Self Care   Consults: None.   Significant Diagnostic Studies: Dg Chest 2 View  07/01/2011  *RADIOLOGY REPORT*  Clinical Data: Elevated blood pressure.  Stroke.  CHEST - 2 VIEW  Comparison: None.  Findings: Numerous leads and wires project over the chest on the frontal view.  Mild to moderate convex right thoracic spine curvature. Normal heart size and mediastinal contours for age.  No pleural effusion or pneumothorax.  Prominent first anterior ribs, greater right than left. Probable artifactual density projecting just medial the left EKG lead over the apex.  IMPRESSION: No acute cardiopulmonary disease.  Probable artifactual density projecting at the left apex. Associated with EKG lead.  Consider repeat frontal film with removal of EKG leads.  Original Report Authenticated By: Consuello Bossier, M.D.   Mr Brain Wo Contrast  07/01/2011  *RADIOLOGY REPORT*  Clinical Data: Sudden onset numbness localized to left arm, now with slight associated weakness and confusion symptoms are now improved. History of headaches, clinically migraine.  MRI HEAD WITHOUT CONTRAST  Technique:  Multiplanar,  multiecho pulse sequences of the brain and surrounding structures were obtained according to standard protocol without intravenous contrast.  Comparison: None.  Findings: There are two subcentimeter foci of restricted diffusion in the left hemisphere consistent with acute infarction.  A 5 mm focus in the left lateral thalamus ( series 3 image 10) could result in right-sided sensory symptoms.  Similarly, a 5 mm focus in the left parietal subcortical white matter ( series 3 image 14) lies directly under the post central sulcus. These appeared represent areas of acute infarction.  Complicated migraine would be an unlikely secondary consideration.  No large vessel cortical infarct is seen to suggest vasculitis.  There is no hemorrhage, mass lesion, hydrocephalus, or  extra-axial fluid.  Mild atrophy is appropriate for the patient's age of 41.  There is mild to moderate chronic microvascular ischemic change in the periventricular and subcortical white matter. No midline shift. Normal pituitary and cerebellar tonsils.  Upper cervical region unremarkable.  Major intracranial vascular structures patent. Negative orbits.  Chronic sinus disease.  No mastoid fluid.  IMPRESSION: There are two small areas of restricted diffusion, one in the left thalamus, the other in the left parietal subcortical white matter which could explain the patient's recent right hemisensory symptoms.  These are likely to represent cerebrovascular disease rather than complicated migraine.  Mild atrophy and chronic microvascular ischemic change.  No hemorrhage or mass lesion.  Original Report Authenticated By: Elsie Stain, M.D.   US Carotid Duplex Bilateral  07/01/2011  *RADIOLOGY REPORT*  Clinical Data: Stroke, TIA, history hypertension  BILATERAL CAROTID DUPLEX ULTRASOUND  Technique: Wallace Cullens scale imaging, color Doppler and duplex ultrasound was performed of bilateral carotid and vertebral arteries in the neck.  Comparison:  None  Criteria:  Quantification of carotid stenosis is based on velocity parameters that correlate the residual internal carotid diameter with NASCET-based stenosis levels, using the diameter of the distal internal carotid lumen as the denominator for stenosis measurement.  The following velocity measurements were obtained:                   PEAK SYSTOLIC/END DIASTOLIC RIGHT ICA:                        77/26cm/sec CCA:                        75/20cm/sec SYSTOLIC ICA/CCA RATIO:     1.02 DIASTOLIC ICA/CCA RATIO:    1.28 ECA:                        85cm/sec  LEFT ICA:                        81/31cm/sec CCA:                        66/15cm/sec SYSTOLIC ICA/CCA RATIO:     1.23 DIASTOLIC ICA/CCA RATIO:    2.01 ECA:                        85cm/sec  Findings:  RIGHT CAROTID ARTERY: Intimal  thickening right CCA and right carotid bulb, less at right ICA and right ECA.  Tortuous right ICA with associated turbulent flow on color Doppler imaging and spectral broadening on waveform analysis.  No significant plaque formation identified.  No high velocity jets.  RIGHT VERTEBRAL  ARTERY:  Patent, antegrade  LEFT CAROTID ARTERY: Mild intimal thickening left CCA and left carotid bulb.  Minimal noncalcified plaque left carotid bulb. Mildly tortuous left carotid system with associated turbulent flow on color Doppler imaging and spectral broadening on waveform analysis.  No high velocity jets.  LEFT VERTEBRAL ARTERY:  Patent, antegrade  IMPRESSION: Tortuous carotid systems. No evidence of significant plaque formation or hemodynamically significant stenosis.  Original Report Authenticated By: Lollie Marrow, M.D.    Microbiology: No results found for this or any previous visit (from the past 240 hour(s)).   Labs: Results for orders placed during the hospital encounter of 07/01/11 (from the past 48 hour(s))  CBC     Status: Normal   Collection Time   07/01/11 11:41 AM      Component Value Range Comment   WBC 6.4  4.0 - 10.5 (K/uL)    RBC 5.01  3.87 - 5.11 (MIL/uL)    Hemoglobin 14.9  12.0 - 15.0 (g/dL)    HCT 95.2  84.1 - 32.4 (%)    MCV 86.6  78.0 - 100.0 (fL)    MCH 29.7  26.0 - 34.0 (pg)    MCHC 34.3  30.0 - 36.0 (g/dL)    RDW 40.1  02.7 - 25.3 (%)    Platelets 262  150 - 400 (K/uL)   DIFFERENTIAL     Status: Normal   Collection Time   07/01/11 11:41 AM      Component Value Range Comment   Neutrophils Relative 66  43 - 77 (%)    Neutro Abs 4.2  1.7 - 7.7 (K/uL)    Lymphocytes Relative 27  12 - 46 (%)    Lymphs Abs 1.7  0.7 - 4.0 (K/uL)    Monocytes Relative 5  3 - 12 (%)    Monocytes Absolute 0.3  0.1 - 1.0 (K/uL)    Eosinophils Relative 2  0 - 5 (%)    Eosinophils Absolute 0.1  0.0 - 0.7 (K/uL)    Basophils Relative 1  0 - 1 (%)    Basophils Absolute 0.0  0.0 - 0.1 (K/uL)   BASIC  METABOLIC PANEL     Status: Abnormal   Collection Time   07/01/11 11:41 AM      Component Value Range Comment   Sodium 142  135 - 145 (mEq/L)    Potassium 3.6  3.5 - 5.1 (mEq/L)    Chloride 104  96 - 112 (mEq/L)    CO2 27  19 - 32 (mEq/L)    Glucose, Bld 111 (*) 70 - 99 (mg/dL)    BUN 10  6 - 23 (mg/dL)    Creatinine, Ser 6.64  0.50 - 1.10 (mg/dL)    Calcium 40.3  8.4 - 10.5 (mg/dL)    GFR calc non Af Amer 56 (*) >90 (mL/min)    GFR calc Af Amer 65 (*) >90 (mL/min)   CARDIAC PANEL(CRET KIN+CKTOT+MB+TROPI)     Status: Normal   Collection Time   07/01/11  6:01 PM      Component Value Range Comment   Total CK 39  7 - 177 (U/L)    CK, MB 1.4  0.3 - 4.0 (ng/mL)    Troponin I <0.30  <0.30 (ng/mL)    Relative Index RELATIVE INDEX IS INVALID  0.0 - 2.5    TSH     Status: Normal   Collection Time   07/01/11  6:01 PM      Component Value  Range Comment   TSH 3.454  0.350 - 4.500 (uIU/mL)   T4, FREE     Status: Normal   Collection Time   07/01/11  6:01 PM      Component Value Range Comment   Free T4 1.17  0.80 - 1.80 (ng/dL)   LIPID PANEL     Status: Abnormal   Collection Time   07/02/11  3:53 AM      Component Value Range Comment   Cholesterol 139  0 - 200 (mg/dL)    Triglycerides 161 (*) <150 (mg/dL)    HDL 42  >09 (mg/dL)    Total CHOL/HDL Ratio 3.3      VLDL 30  0 - 40 (mg/dL)    LDL Cholesterol 67  0 - 99 (mg/dL)   CARDIAC PANEL(CRET KIN+CKTOT+MB+TROPI)     Status: Normal   Collection Time   07/02/11  3:53 AM      Component Value Range Comment   Total CK 33  7 - 177 (U/L)    CK, MB 1.3  0.3 - 4.0 (ng/mL)    Troponin I <0.30  <0.30 (ng/mL)    Relative Index RELATIVE INDEX IS INVALID  0.0 - 2.5    COMPREHENSIVE METABOLIC PANEL     Status: Abnormal   Collection Time   07/02/11  3:53 AM      Component Value Range Comment   Sodium 139  135 - 145 (mEq/L)    Potassium 3.5  3.5 - 5.1 (mEq/L)    Chloride 102  96 - 112 (mEq/L)    CO2 27  19 - 32 (mEq/L)    Glucose, Bld 97  70 - 99  (mg/dL)    BUN 12  6 - 23 (mg/dL)    Creatinine, Ser 6.04 (*) 0.50 - 1.10 (mg/dL)    Calcium 9.4  8.4 - 10.5 (mg/dL)    Total Protein 6.4  6.0 - 8.3 (g/dL)    Albumin 3.7  3.5 - 5.2 (g/dL)    AST 16  0 - 37 (U/L)    ALT 11  0 - 35 (U/L)    Alkaline Phosphatase 66  39 - 117 (U/L)    Total Bilirubin 0.9  0.3 - 1.2 (mg/dL)    GFR calc non Af Amer 50 (*) >90 (mL/min)    GFR calc Af Amer 58 (*) >90 (mL/min)   CBC     Status: Normal   Collection Time   07/02/11  3:53 AM      Component Value Range Comment   WBC 6.0  4.0 - 10.5 (K/uL)    RBC 4.51  3.87 - 5.11 (MIL/uL)    Hemoglobin 13.4  12.0 - 15.0 (g/dL)    HCT 54.0  98.1 - 19.1 (%)    MCV 88.2  78.0 - 100.0 (fL)    MCH 29.7  26.0 - 34.0 (pg)    MCHC 33.7  30.0 - 36.0 (g/dL)    RDW 47.8  29.5 - 62.1 (%)    Platelets 255  150 - 400 (K/uL)      HPI : The patient is a 66 year old woman with a past medical history significant for migraine headaches with aura, TIA, and hyperlipidemia, who presented to the emergency department on April 19th 2013 with a chief complaint of left-sided numbness. In the emergency department, she was noted to be hypertensive with a blood pressure ranging in the 160s to 180s systolically. Otherwise, she was hemodynamically stable and afebrile. An MRI of her brain  was ordered in the emergency department and it revealed 2 small areas of infarction on the left. She was admitted for further evaluation and management. For additional details, see the dictated history and physical.  HOSPITAL COURSE: The patient reported that she had been taking aspirin daily on a consistent basis. Therefore, aspirin was discontinued in favor of Plavix. Of note, she was given a 325 mg aspirin tablet in the emergency department. She passed the swallow evaluation at the bedside in the emergency department. Her blood pressure was monitored without any antihypertensive medication ordered. For further evaluation, a number of studies were ordered. Her  carotid ultrasound revealed no significant ICA stenosis. Her TSH and free T4 were within normal limits. The results of her fasting lipid profile were dictated above. Her cardiac enzymes were well within normal limits. Her chest x-ray revealed no acute cardiopulmonary disease. Her electrolytes were fairly normal. Her CBC was within normal limits. A 2-D echocardiogram was ordered, however, was not performed before the technician left for the weekend.  Over the course of the hospitalization, the patient's blood pressure normalized. There was no need for antihypertensive medication therapy, however, she will need to have her blood pressure monitored in the outpatient setting for further assessment of hypertension. On exam, prior to hospital discharge, she had no residual left-sided paresthesias or numbness. Her gait was steady. She had no unilateral weakness and no obvious gross cranial nerve deficits.  She was discharged home on Plavix. She was advised to discontinue aspirin. For treatment of migraine headaches, she was given a prescription of Percocet. She was advised to use caution when taking 3 different NSAIDs that she had been prescribed prior to this hospitalization. She was instructed to discontinue Motrin altogether and to use Mobic and diclofenac occasionally in the setting of Plavix therapy.  She will be called for outpatient scheduling of the 2-D echocardiogram. Otherwise, she was advised to followup with her neurologist Dr. Anne Hahn and with her primary care physician in [place].     Discharge Exam: Blood pressure 106/67, pulse 56, temperature 98 F (36.7 C), temperature source Oral, resp. rate 16, height 5\' 2"  (1.575 m), weight 52.164 kg (115 lb), SpO2 96.00%.   lungs: Clear to auscultation bilaterally. Heart: S1, S2, with a soft systolic murmur and no ectopy. Abdomen: Positive bowel sounds, soft, nontender, nondistended. Extremities: No pedal edema. Neurologic: She is alert and oriented  x3. Cranial nerves II through XII are intact. Sensation is grossly intact symmetrically and bilaterally. Gait is within normal limits without free tract, ataxia, or any other acute abnormalities.    Discharge Orders    Future Orders Please Complete By Expires   Diet - low sodium heart healthy      Increase activity slowly      Discharge instructions      Comments:   Use caution when taking diclofenac for your headache and Mobic for pain while you're on Plavix. It predisposes you to stomach upset and bleeding. Discontinue Motrin. Dr. Sherrie Mustache will call you regarding an outpatient appointment for your echocardiogram.      Follow-up Information    Follow up with Lesly Dukes, MD. (Followup as scheduled)    Contact information:   [address], [address] [place] [address] [facility] [address] [phone]       Please follow up. (Followup with your primary care physician in one to 2 weeks. Call for the appointment.)           Total discharge  time: 35 minutes.  Signed: Modelle Vollmer 07/02/2011, 10:12 AM

## 2011-07-03 NOTE — Progress Notes (Signed)
Patient received discharge instructions along with follow up appointments and prescriptions. Patient verbalized understanding of all instructions. Patient was escorted by staff via wheelchair to vehicle. Patient discharged to home in stable condition. 

## 2011-07-06 ENCOUNTER — Ambulatory Visit (HOSPITAL_COMMUNITY)
Admission: RE | Admit: 2011-07-06 | Discharge: 2011-07-06 | Disposition: A | Discharge status: Home / Self Care | Payer: Medicare FFS | Source: Ambulatory Visit | Attending: Internal Medicine | Admitting: Internal Medicine

## 2011-07-06 DIAGNOSIS — I6350 Cerebral infarction due to unspecified occlusion or stenosis of unspecified cerebral artery: Secondary | ICD-10-CM | POA: Insufficient documentation

## 2011-07-06 DIAGNOSIS — E785 Hyperlipidemia, unspecified: Secondary | ICD-10-CM | POA: Insufficient documentation

## 2011-07-06 DIAGNOSIS — I6789 Other cerebrovascular disease: Secondary | ICD-10-CM

## 2011-07-06 DIAGNOSIS — R209 Unspecified disturbances of skin sensation: Secondary | ICD-10-CM | POA: Insufficient documentation

## 2011-07-06 DIAGNOSIS — I10 Essential (primary) hypertension: Secondary | ICD-10-CM | POA: Insufficient documentation

## 2011-07-06 NOTE — Progress Notes (Signed)
*  PRELIMINARY RESULTS* Echocardiogram 2D Echocardiogram has been performed.  Lauren Sanders 07/06/2011, 9:05 AM

## 2011-07-11 NOTE — Progress Notes (Signed)
UR Chart Review Completed  

## 2011-07-23 ENCOUNTER — Emergency Department (HOSPITAL_COMMUNITY)
Admission: EM | Admit: 2011-07-23 | Discharge: 2011-07-23 | Disposition: A | Discharge status: Home / Self Care | Payer: Medicare FFS | Attending: Emergency Medicine | Admitting: Emergency Medicine

## 2011-07-23 ENCOUNTER — Encounter (HOSPITAL_COMMUNITY): Payer: Self-pay | Admitting: Emergency Medicine

## 2011-07-23 ENCOUNTER — Emergency Department (HOSPITAL_COMMUNITY): Payer: Medicare FFS

## 2011-07-23 DIAGNOSIS — E7800 Pure hypercholesterolemia, unspecified: Secondary | ICD-10-CM | POA: Insufficient documentation

## 2011-07-23 DIAGNOSIS — Z8673 Personal history of transient ischemic attack (TIA), and cerebral infarction without residual deficits: Secondary | ICD-10-CM | POA: Insufficient documentation

## 2011-07-23 DIAGNOSIS — K5900 Constipation, unspecified: Secondary | ICD-10-CM | POA: Insufficient documentation

## 2011-07-23 DIAGNOSIS — R1031 Right lower quadrant pain: Secondary | ICD-10-CM | POA: Insufficient documentation

## 2011-07-23 HISTORY — DX: Cerebral infarction, unspecified: I63.9

## 2011-07-23 LAB — URINALYSIS, ROUTINE W REFLEX MICROSCOPIC
Glucose, UA: NEGATIVE mg/dL
Hgb urine dipstick: NEGATIVE
Ketones, ur: NEGATIVE mg/dL
Protein, ur: NEGATIVE mg/dL

## 2011-07-23 LAB — BASIC METABOLIC PANEL
CO2: 26 mEq/L (ref 19–32)
Calcium: 9.6 mg/dL (ref 8.4–10.5)
Creatinine, Ser: 0.96 mg/dL (ref 0.50–1.10)

## 2011-07-23 LAB — CBC
HCT: 37.5 % (ref 36.0–46.0)
MCH: 29.5 pg (ref 26.0–34.0)
MCHC: 33.9 g/dL (ref 30.0–36.0)
MCV: 87 fL (ref 78.0–100.0)
RDW: 12.2 % (ref 11.5–15.5)
WBC: 5.2 10*3/uL (ref 4.0–10.5)

## 2011-07-23 LAB — DIFFERENTIAL
Basophils Absolute: 0 10*3/uL (ref 0.0–0.1)
Eosinophils Relative: 2 % (ref 0–5)
Lymphocytes Relative: 24 % (ref 12–46)
Monocytes Absolute: 0.3 10*3/uL (ref 0.1–1.0)

## 2011-07-23 MED ORDER — POLYETHYLENE GLYCOL 3350 17 G PO PACK
17.0000 g | PACK | Freq: Once | ORAL | Status: AC
  Administered 2011-07-23: 17 g via ORAL
  Filled 2011-07-23: qty 1

## 2011-07-23 NOTE — Discharge Instructions (Signed)
Use MiraLAX twice a day for 1 week, then once a day for 1 week, to improve your stooling. After that, use Colace twice a day for 1 month. Increase fluid and fiber in your diet. See your Dr. if not better in one week.  Constipation in Adults Constipation is having fewer than 2 bowel movements per week. Usually, the stools are hard. As we grow older, constipation is more common. If you try to fix constipation with laxatives, the problem may get worse. This is because laxatives taken over a long period of time make the colon muscles weaker. A low-fiber diet, not taking in enough fluids, and taking some medicines may make these problems worse. MEDICATIONS THAT MAY CAUSE CONSTIPATION  Water pills (diuretics).   Calcium channel blockers (used to control blood pressure and for the heart).   Certain pain medicines (narcotics).   Anticholinergics.   Anti-inflammatory agents.   Antacids that contain aluminum.  DISEASES THAT CONTRIBUTE TO CONSTIPATION  Diabetes.   Parkinson's disease.   Dementia.   Stroke.   Depression.   Illnesses that cause problems with salt and water metabolism.  HOME CARE INSTRUCTIONS   Constipation is usually best cared for without medicines. Increasing dietary fiber and eating more fruits and vegetables is the best way to manage constipation.   Slowly increase fiber intake to 25 to 38 grams per day. Whole grains, fruits, vegetables, and legumes are good sources of fiber. A dietitian can further help you incorporate high-fiber foods into your diet.   Drink enough water and fluids to keep your urine clear or pale yellow.   A fiber supplement may be added to your diet if you cannot get enough fiber from foods.   Increasing your activities also helps improve regularity.   Suppositories, as suggested by your caregiver, will also help. If you are using antacids, such as aluminum or calcium containing products, it will be helpful to switch to products containing  magnesium if your caregiver says it is okay.   If you have been given a liquid injection (enema) today, this is only a temporary measure. It should not be relied on for treatment of longstanding (chronic) constipation.   Stronger measures, such as magnesium sulfate, should be avoided if possible. This may cause uncontrollable diarrhea. Using magnesium sulfate may not allow you time to make it to the bathroom.  SEEK IMMEDIATE MEDICAL CARE IF:   There is bright red blood in the stool.   The constipation stays for more than 4 days.   There is belly (abdominal) or rectal pain.   You do not seem to be getting better.   You have any questions or concerns.  MAKE SURE YOU:   Understand these instructions.   Will watch your condition.   Will get help right away if you are not doing well or get worse.  Document Released: 11/27/2003 Document Revised: 02/17/2011 Document Reviewed: 02/01/2011 [facility] Patient Information 2012 [place], [place].

## 2011-07-23 NOTE — ED Notes (Signed)
rlq pain x 2 days intermittant. Denies gu sx's. lnbm this am -denies black or bloody stools. But states when she wipes it is yellow in color. Denies n/v. Denies radiation of pain

## 2011-07-23 NOTE — ED Provider Notes (Signed)
History   This chart was scribed for Flint Melter, MD by Sofie Rower. The patient was seen in room APA06/APA06 and the patient's care was started at 1:53 PM     CSN: [id number]  Arrival date & time 07/23/11  1151   First MD Initiated Contact with Patient 07/23/11 1341      Chief Complaint  Patient presents with  . Abdominal Pain    (Consider location/radiation/quality/duration/timing/severity/associated sxs/prior treatment) HPI  Lauren Sanders is a 66 y.o. female who presents to the Emergency Department complaining of moderate, intermittent abdominal pain located at the RLQ onset two days ago with associated symptoms of constipation. The pt states "she has been hurting in her side since Wednesday, however, the pain has gotten worse today." The pt informs the EDP that her last bowel movement was this morning, it was hard, she did not have to strain, when she wiped, it was yellow like bile." Pt states she "has been able to eat well for the past few days." The patient informs the EDP that the constipation consists of "hard stools." The pt states there is "sometimes 2-3 days between bowel movements." Pt rates the pain 8/10 at worst. Pt states there "is no pain at present." Modifying factors include taking mineral oil which provides moderate relief, stool softener (stopped taking on Monday) which provides moderate relief, drinking prune juice which provides moderate relief, walking/raising the legs which intensifies the pain. Pt has a hx of stroke (April 19-20, 2013) after which she saw her PCP and was put on Lisinopril.  Pt denies nausea, vomiting, melena, blood in stools, fever, radiating pain, cough, difficulty breathing.  PCP is Dr. Gabriel Rung in [place], [place].     Past Medical History  Diagnosis Date  . Migraine with aura   . Hypercholesteremia   . Arthritis   . TIA (transient ischemic attack)   . Rheumatic fever     As a child.  . Stroke     Past Surgical History  Procedure Date    . Abdominal hysterectomy   . Bladder tacking       History  Substance Use Topics  . Smoking status: Never Smoker   . Smokeless tobacco: Not on file  . Alcohol Use: No    OB History    Grav Para Term Preterm Abortions TAB SAB Ect Mult Living                  Review of Systems  All other systems reviewed and are negative.    10 Systems reviewed and all are negative for acute change except as noted in the HPI.    Allergies  Review of patient's allergies indicates no known allergies.  Home Medications   Current Outpatient Rx  Name Route Sig Dispense Refill  . ATORVASTATIN CALCIUM 20 MG PO TABS Oral Take 20 mg by mouth daily.    Marland Kitchen BENAZEPRIL HCL 10 MG PO TABS Oral Take 10 mg by mouth daily.    Marland Kitchen VITAMIN D-3 5000 UNITS PO TABS Oral Take 1 tablet by mouth daily.    Marland Kitchen CLOPIDOGREL BISULFATE 75 MG PO TABS Oral Take 1 tablet (75 mg total) by mouth daily with breakfast. 30 tablet 3  . OMEGA-3 FATTY ACIDS 1000 MG PO CAPS Oral Take 2 g by mouth daily.    Marland Kitchen FOLIC ACID 400 MCG PO TABS Oral Take 400 mcg by mouth daily.    Marland Kitchen OMEPRAZOLE 20 MG PO CPDR Oral Take 20 mg by mouth daily.    Marland Kitchen  ONDANSETRON HCL 4 MG PO TABS Oral Take 4 mg by mouth every 8 (eight) hours as needed. For nausea    . OXYCODONE-ACETAMINOPHEN 5-325 MG PO TABS Oral Take 1 tablet by mouth every 4 (four) hours as needed. pain    . VERAPAMIL HCL ER 240 MG PO CP24 Oral Take 240 mg by mouth daily.    Marland Kitchen DICLOFENAC POTASSIUM 50 MG PO PACK Oral Take 1 each by mouth daily as needed. Pain      BP 164/64  Pulse 63  Temp(Src) 98.1 F (36.7 C) (Oral)  Resp 20  Ht 5\' 2"  (1.575 m)  Wt 115 lb (52.164 kg)  BMI 21.03 kg/m2  SpO2 98%  Physical Exam  Nursing note and vitals reviewed. Constitutional: She is oriented to person, place, and time. She appears well-developed and well-nourished.  HENT:  Head: Normocephalic and atraumatic.  Eyes: Conjunctivae and EOM are normal. Pupils are equal, round, and reactive to light.   Neck: Normal range of motion and phonation normal. Neck supple.  Cardiovascular: Normal rate, regular rhythm and intact distal pulses.   Pulmonary/Chest: Effort normal and breath sounds normal. She exhibits no tenderness.  Abdominal: Soft. She exhibits no distension. There is tenderness (Moderate tenderness trhoughout.). There is no guarding.  Musculoskeletal: Normal range of motion.       Scoliosis detected,    Neurological: She is alert and oriented to person, place, and time. She has normal strength. She exhibits normal muscle tone.  Skin: Skin is warm and dry.  Psychiatric: She has a normal mood and affect. Her behavior is normal. Judgment and thought content normal.    ED Course  Procedures (including critical care time)  2:04PM- EDP at bedside discusses treatment plan.   2:06PM- EDP at bedside to conduct rectal exam.   2:07PM- EDP at bedside discusses treatment plan concerning blood count, x-ray.   DIAGNOSTIC STUDIES: Oxygen Saturation is 98% on room air, normal by my interpretation.    COORDINATION OF CARE:  16:16- Alert, calm and comfortable. No further c/o.    Labs Reviewed  BASIC METABOLIC PANEL - Abnormal; Notable for the following:    Potassium 3.2 (*)    GFR calc non Af Amer 60 (*)    GFR calc Af Amer 69 (*)    All other components within normal limits  CBC  DIFFERENTIAL  URINALYSIS, ROUTINE W REFLEX MICROSCOPIC  OCCULT BLOOD, POC DEVICE    Dg Abd 1 View  07/23/2011  *RADIOLOGY REPORT*  Clinical Data: Right lower quadrant pain, constipation  ABDOMEN - 1 VIEW  Comparison: None.  Findings: There is a large volume stool in the ascending colon. Moderate volume stool in the descending colon and rectum.  No dilated loops of large small bowel.  No pathologic calcifications. There is scoliosis of the spine and osteophytosis.  IMPRESSION: Large to moderate volume of stool consistent constipation.  Original Report Authenticated By: Genevive Bi, M.D.        1.  Constipation       MDM  Evaluation is consistent with constipation, and KUB confirms large-volume stool in colon. Doubt obstructive pathology, serious bacterial infection or metabolic instability. She is stable for discharge with outpatient management.      I personally performed the services described in this documentation, which was scribed in my presence. The recorded information has been reviewed and considered.     Flint Melter, MD 07/23/11 [phone]

## 2011-08-26 ENCOUNTER — Other Ambulatory Visit: Payer: Self-pay | Admitting: Neurology

## 2011-08-26 DIAGNOSIS — I63239 Cerebral infarction due to unspecified occlusion or stenosis of unspecified carotid arteries: Secondary | ICD-10-CM

## 2011-08-26 DIAGNOSIS — G43109 Migraine with aura, not intractable, without status migrainosus: Secondary | ICD-10-CM

## 2011-09-13 ENCOUNTER — Ambulatory Visit
Admission: RE | Admit: 2011-09-13 | Discharge: 2011-09-13 | Disposition: A | Discharge status: Home / Self Care | Payer: Medicare FFS | Source: Ambulatory Visit | Attending: Neurology | Admitting: Neurology

## 2011-09-13 DIAGNOSIS — I63239 Cerebral infarction due to unspecified occlusion or stenosis of unspecified carotid arteries: Secondary | ICD-10-CM

## 2011-09-13 DIAGNOSIS — G43109 Migraine with aura, not intractable, without status migrainosus: Secondary | ICD-10-CM

## 2012-07-04 ENCOUNTER — Encounter: Payer: Self-pay | Admitting: Neurology

## 2012-07-05 ENCOUNTER — Ambulatory Visit (INDEPENDENT_AMBULATORY_CARE_PROVIDER_SITE_OTHER): Payer: Medicare PPO | Admitting: Neurology

## 2012-07-05 ENCOUNTER — Encounter: Payer: Self-pay | Admitting: Neurology

## 2012-07-05 VITALS — BP 121/64 | HR 60 | Ht 61.75 in | Wt 116.0 lb

## 2012-07-05 DIAGNOSIS — G43909 Migraine, unspecified, not intractable, without status migrainosus: Secondary | ICD-10-CM

## 2012-07-05 MED ORDER — LANSOPRAZOLE 30 MG PO CPDR: 30.0000 mg | DELAYED_RELEASE_CAPSULE | Freq: Every day | ORAL | Status: DC

## 2012-07-05 NOTE — Progress Notes (Signed)
Reason for visit: Headache  Lauren Sanders is an 67 y.o. female  History of present illness:  Lauren Sanders is a 67 year old right-handed white female with a history of migraine headache. The patient has a history of pulsatile tinnitus, and she has a persistent crescent shaped visual field deficit involving the right eye only in the temporal field. The patient has had ophthalmologic evaluation that has shown no issues with the retina. The patient denies any significant issues with headache. The patient may have a dull achy sensation in the head in the morning that disappears within 30 minutes after getting up. The patient denies any problems with snoring at night. The patient is on verapamil, and she is no longer taking Relpax. The patient could not afford the Cambia for the headache. The patient takes Voltaren if needed. The patient returns for an evaluation. No other new medical issues have come up since last seen. The patient will have occasional episodes of increased pulsatile tinnitus and spots in front of the eyes.  Past Medical History  Diagnosis Date  . Migraine with aura   . Hypercholesteremia   . Arthritis   . TIA (transient ischemic attack)   . Rheumatic fever     As a child.  . Stroke     Past Surgical History  Procedure Laterality Date  . Abdominal hysterectomy    . Bladder tacking      No family history on file.  Social history:  reports that she has never smoked. She does not have any smokeless tobacco history on file. She reports that she does not drink alcohol or use illicit drugs.  Allergies: No Known Allergies  Medications:  Current Outpatient Prescriptions on File Prior to Visit  Medication Sig Dispense Refill  . atorvastatin (LIPITOR) 20 MG tablet Take 20 mg by mouth daily.      . benazepril (LOTENSIN) 10 MG tablet Take 10 mg by mouth daily.      . Cholecalciferol (VITAMIN D-3) 5000 UNITS TABS Take 1 tablet by mouth daily.      . Diclofenac Potassium  (CAMBIA) 50 MG PACK Take 1 each by mouth daily as needed. Pain      . fish oil-omega-3 fatty acids 1000 MG capsule Take 2 g by mouth daily.      . folic acid (FOLVITE) 400 MCG tablet Take 400 mcg by mouth daily.      . verapamil (VERELAN PM) 240 MG 24 hr capsule Take 240 mg by mouth daily.       No current facility-administered medications on file prior to visit.    ROS:  Out of a complete 14 system review of symptoms, the patient complains only of the following symptoms, and all other reviewed systems are negative.  Memory loss, headache Ringing in the ears Constipation  Blood pressure 121/64, pulse 60, height 5' 1.75" (1.568 m), weight 116 lb (52.617 kg).  Physical Exam  General: The patient is alert and cooperative at the time of the examination.  Skin: No significant peripheral edema is noted.   Neurologic Exam  Cranial nerves: Facial symmetry is present. Speech is normal, no aphasia or dysarthria is noted. Extraocular movements are full. Visual fields are full.  Motor: The patient has good strength in all 4 extremities.  Coordination: The patient has good finger-nose-finger and heel-to-shin bilaterally.  Gait and station: The patient has a normal gait. Tandem gait is normal. Romberg is negative. No drift is seen.  Reflexes: Deep tendon reflexes are symmetric.  Assessment/Plan:  1. History of migraine  2. Pulsatile tinnitus, left side  3. Cerebrovascular disease, left thalamic infarct  The patient is doing relatively well at this point. The patient is on Plavix, and she has been recently placed on Prilosec. These 2 medications have an interaction with one another. The patient will be taken off of Prilosec, and switched to Prevacid. The patient will followup through this office in 6-8 months.  Marlan Palau MD 07/05/2012 12:50 PM  [facility] [address] [place], [place] [address]  Phone [phone] Fax [phone]

## 2013-01-03 ENCOUNTER — Ambulatory Visit (INDEPENDENT_AMBULATORY_CARE_PROVIDER_SITE_OTHER): Payer: Medicare PPO | Admitting: Neurology

## 2013-01-03 ENCOUNTER — Encounter: Payer: Self-pay | Admitting: Neurology

## 2013-01-03 ENCOUNTER — Encounter (INDEPENDENT_AMBULATORY_CARE_PROVIDER_SITE_OTHER): Payer: Self-pay

## 2013-01-03 VITALS — BP 119/65 | HR 57 | Wt 117.0 lb

## 2013-01-03 DIAGNOSIS — G43909 Migraine, unspecified, not intractable, without status migrainosus: Secondary | ICD-10-CM

## 2013-01-03 NOTE — Progress Notes (Signed)
Reason for visit: Visual field disturbance  Lauren Sanders is an 67 y.o. female  History of present illness:  Lauren Sanders is a 67 year old right-handed white female with a history of a left thalamic stroke. The patient has a monocular visual field deficit with a temporal crescent field deficit that has been persistent. The patient does have migraine headaches. The patient is on Plavix, and she is doing well with this. The patient reports no new issues that have come up since last seen. The patient does report some neck discomfort when she lies down at night, otherwise she is not having any issues with her neck. The patient returns for an evaluation. The patient will occasionally have a left-sided headache, and she has pulsatile tinnitus affecting the left ear.  Past Medical History  Diagnosis Date  . Migraine with aura   . Hypercholesteremia   . Arthritis   . TIA (transient ischemic attack)   . Rheumatic fever     As a child.  . Stroke     Past Surgical History  Procedure Laterality Date  . Abdominal hysterectomy    . Bladder tacking      History reviewed. No pertinent family history.  Social history:  reports that she has never smoked. She does not have any smokeless tobacco history on file. She reports that she does not drink alcohol or use illicit drugs.   No Known Allergies  Medications:  Current Outpatient Prescriptions on File Prior to Visit  Medication Sig Dispense Refill  . atorvastatin (LIPITOR) 20 MG tablet Take 20 mg by mouth daily.      . Cholecalciferol (VITAMIN D-3) 5000 UNITS TABS Take 1 tablet by mouth daily.      . clopidogrel (PLAVIX) 75 MG tablet Take 75 mg by mouth daily.       . Diclofenac Potassium (CAMBIA) 50 MG PACK Take 1 each by mouth daily as needed. Pain      . fish oil-omega-3 fatty acids 1000 MG capsule Take 2 g by mouth daily.      . folic acid (FOLVITE) 400 MCG tablet Take 400 mcg by mouth daily.      . lansoprazole (PREVACID) 30 MG capsule  Take 1 capsule (30 mg total) by mouth daily.  30 capsule  5  . verapamil (VERELAN PM) 240 MG 24 hr capsule Take 240 mg by mouth daily.       No current facility-administered medications on file prior to visit.    ROS:  Out of a complete 14 system review of symptoms, the patient complains only of the following symptoms, and all other reviewed systems are negative.  Memory loss, confusion, headache Vision disturbance  Blood pressure 119/65, pulse 57, weight 117 lb (53.071 kg).  Physical Exam  General: The patient is alert and cooperative at the time of the examination.  Skin: No significant peripheral edema is noted.   Neurologic Exam  Mental status: The patient is oriented x 3.  Cranial nerves: Facial symmetry is present. Speech is normal, no aphasia or dysarthria is noted. Extraocular movements are full. Visual fields are full.  Motor: The patient has good strength in all 4 extremities.  Sensory examination: Soft touch sensation is symmetric throughout.  Coordination: The patient has good finger-nose-finger and heel-to-shin bilaterally.  Gait and station: The patient has a normal gait. Tandem gait is normal. Romberg is negative. No drift is seen.  Reflexes: Deep tendon reflexes are symmetric.   Assessment/Plan:  One. Left thalamic stroke  2.  Migraine headache  The patient is doing relatively well at this point. She will continue on the Plavix and take diclofenac if needed for the headache. The patient will followup through this office in one year.  Lauren Palau MD 01/03/2013 9:14 PM  [facility] [address] [place], [place] [address]  Phone [phone] Fax [phone]

## 2013-02-28 ENCOUNTER — Other Ambulatory Visit: Payer: Self-pay | Admitting: Neurology

## 2013-03-04 ENCOUNTER — Other Ambulatory Visit: Payer: Self-pay | Admitting: Neurology

## 2013-03-04 NOTE — Telephone Encounter (Signed)
Prescribing Provider Encounter Provider   [name], MD [name], MD         Medication Detail      Disp Refills Start End     lansoprazole (PREVACID) 30 MG capsule 30 capsule 6 02/28/2013     Sig: TAKE ONE CAPSULE EVERY DAY    E-Prescribing Status: Receipt confirmed by pharmacy (02/28/2013 2:48 PM EST)                Pharmacy    [id number] [place], [place] - [address]

## 2013-12-11 ENCOUNTER — Telehealth: Payer: Self-pay | Admitting: Neurology

## 2013-12-11 NOTE — Telephone Encounter (Signed)
Made several attempts to contact patient to schedule appointment her line is busy. I will try again later.

## 2013-12-11 NOTE — Telephone Encounter (Signed)
Patient requesting an earlier appointment with Dr. Anne HahnWillis, appointment scheduled 10/28.  Experiencing tingling sensation and soreness on scalp.  Unable to lay comfortably on pillow at night.  Please call anytime and may leave message on voicemail.

## 2013-12-17 ENCOUNTER — Ambulatory Visit (INDEPENDENT_AMBULATORY_CARE_PROVIDER_SITE_OTHER): Payer: Medicare HMO | Admitting: Neurology

## 2013-12-17 ENCOUNTER — Encounter: Payer: Self-pay | Admitting: Neurology

## 2013-12-17 ENCOUNTER — Encounter (INDEPENDENT_AMBULATORY_CARE_PROVIDER_SITE_OTHER): Payer: Self-pay

## 2013-12-17 VITALS — BP 108/67 | HR 61 | Ht 62.0 in | Wt 119.2 lb

## 2013-12-17 DIAGNOSIS — R519 Headache, unspecified: Secondary | ICD-10-CM

## 2013-12-17 DIAGNOSIS — R51 Headache: Secondary | ICD-10-CM

## 2013-12-17 DIAGNOSIS — G43709 Chronic migraine without aura, not intractable, without status migrainosus: Secondary | ICD-10-CM

## 2013-12-17 NOTE — Progress Notes (Signed)
Reason for visit: Cerebrovascular disease, migraine  Lauren Sanders Apo is an 68 y.o. female  History of present illness:  Ms. Lauren Sanders is a 68 year old right-handed white female with a history of a prior left thalamic stroke. The patient does have a history of migraine headache, but she has done well with this. Within the last 4 weeks, the patient has developed a right occipital headache that has spread to the left side, associated with scalp tenderness, neck discomfort on the left, stiffness of the neck, and some tingling in the back of the head. The patient believes that the headache has gradually improved a bit over time. She has not had any discomfort down the arms, she denies any problems with weakness of the extremities, she has not had any sinus drainage, vision disturbance, gait disturbance, or confusion. She reports no numbness of the extremities. She was given a trial on doxycycline without benefit. She returns to this office for an evaluation. She also reports significant issues with constipation that has been present for the last couple months.  Past Medical History  Diagnosis Date  . Migraine with aura   . Hypercholesteremia   . Arthritis   . TIA (transient ischemic attack)   . Rheumatic fever     As a child.  . Stroke     Past Surgical History  Procedure Laterality Date  . Abdominal hysterectomy    . Bladder tacking      History reviewed. No pertinent family history.  Social history:  reports that she has never smoked. She has never used smokeless tobacco. She reports that she does not drink alcohol or use illicit drugs.   No Known Allergies  Medications:  Current Outpatient Prescriptions on File Prior to Visit  Medication Sig Dispense Refill  . atorvastatin (LIPITOR) 20 MG tablet Take 20 mg by mouth daily.      . Cholecalciferol (VITAMIN D-3) 5000 UNITS TABS Take 1 tablet by mouth daily.      . clopidogrel (PLAVIX) 75 MG tablet Take 75 mg by mouth daily.       .  Diclofenac Potassium (CAMBIA) 50 MG PACK Take 1 each by mouth daily as needed. Pain      . fish oil-omega-3 fatty acids 1000 MG capsule Take 2 g by mouth daily.      . folic acid (FOLVITE) 400 MCG tablet Take 400 mcg by mouth daily.      . lansoprazole (PREVACID) 30 MG capsule TAKE ONE CAPSULE EVERY DAY  30 capsule  6  . losartan (COZAAR) 50 MG tablet Take 50 mg by mouth daily.      . verapamil (VERELAN PM) 240 MG 24 hr capsule Take 240 mg by mouth daily.       No current facility-administered medications on file prior to visit.    ROS:  Out of a complete 14 system review of symptoms, the patient complains only of the following symptoms, and all other reviewed systems are negative.  Ear pain, ringing in the ears Constipation Memory loss  Blood pressure 108/67, pulse 61, height 5\' 2"  (1.575 m), weight 119 lb 3.2 oz (54.069 kg).  Physical Exam  General: The patient is alert and cooperative at the time of the examination.   Neuromuscular: Range of movement of the cervical spine lacks only about 10 of full lateral rotation bilaterally.  Skin: No significant peripheral edema is noted.   Neurologic Exam  Mental status: The patient is oriented x 3.  Cranial nerves: Facial symmetry  is present. Speech is normal, no aphasia or dysarthria is noted. Extraocular movements are full. Visual fields are full.  Motor: The patient has good strength in all 4 extremities.  Sensory examination: Soft touch sensation is symmetric on the face, arms, and legs.  Coordination: The patient has good finger-nose-finger and heel-to-shin bilaterally.  Gait and station: The patient has a normal gait. Tandem gait is slightly unsteady. Romberg is negative. No drift is seen.  Reflexes: Deep tendon reflexes are symmetric.   Assessment/Plan:  1. Cerebrovascular disease, history of left thalamic stroke  2. History of migraine headache  3. New onset headache, likely cervicogenic  The patient appears to  have a new type of headache associated with burning sensations and tenderness of the scalp and occipital area. The patient does have some neck stiffness associated with this. She will be sent for a sedimentation rate, and she will be started on Advil taking 600 mg 3 times daily for the next 3 weeks. If the headaches do not improve, she is to contact our office. The patient is having some troubles with constipation, and she will go on MiraLax, Docusate, and she is to increase the fiber in her diet. She will followup otherwise in 6 months.  Marlan Palau MD 12/17/2013 8:07 PM  [facility] [address] [place], [place] [address]  Phone [phone] Fax [phone]

## 2013-12-17 NOTE — Patient Instructions (Signed)
  Start miralax one scoop (17 GM) daily. Start colace (docusate) daily.  Migraine Headache A migraine headache is an intense, throbbing pain on one or both sides of your head. A migraine can last for 30 minutes to several hours. CAUSES  The exact cause of a migraine headache is not always known. However, a migraine may be caused when nerves in the brain become irritated and release chemicals that cause inflammation. This causes pain. Certain things may also trigger migraines, such as:  Alcohol.  Smoking.  Stress.  Menstruation.  Aged cheeses.  Foods or drinks that contain nitrates, glutamate, aspartame, or tyramine.  Lack of sleep.  Chocolate.  Caffeine.  Hunger.  Physical exertion.  Fatigue.  Medicines used to treat chest pain (nitroglycerine), birth control pills, estrogen, and some blood pressure medicines. SIGNS AND SYMPTOMS  Pain on one or both sides of your head.  Pulsating or throbbing pain.  Severe pain that prevents daily activities.  Pain that is aggravated by any physical activity.  Nausea, vomiting, or both.  Dizziness.  Pain with exposure to bright lights, loud noises, or activity.  General sensitivity to bright lights, loud noises, or smells. Before you get a migraine, you may get warning signs that a migraine is coming (aura). An aura may include:  Seeing flashing lights.  Seeing bright spots, halos, or zigzag lines.  Having tunnel vision or blurred vision.  Having feelings of numbness or tingling.  Having trouble talking.  Having muscle weakness. DIAGNOSIS  A migraine headache is often diagnosed based on:  Symptoms.  Physical exam.  A CT scan or MRI of your head. These imaging tests cannot diagnose migraines, but they can help rule out other causes of headaches. TREATMENT Medicines may be given for pain and nausea. Medicines can also be given to help prevent recurrent migraines.  HOME CARE INSTRUCTIONS  Only take  over-the-counter or prescription medicines for pain or discomfort as directed by your health care provider. The use of long-term narcotics is not recommended.  Lie down in a dark, quiet room when you have a migraine.  Keep a journal to find out what may trigger your migraine headaches. For example, write down:  What you eat and drink.  How much sleep you get.  Any change to your diet or medicines.  Limit alcohol consumption.  Quit smoking if you smoke.  Get 7-9 hours of sleep, or as recommended by your health care provider.  Limit stress.  Keep lights dim if bright lights bother you and make your migraines worse. SEEK IMMEDIATE MEDICAL CARE IF:   Your migraine becomes severe.  You have a fever.  You have a stiff neck.  You have vision loss.  You have muscular weakness or loss of muscle control.  You start losing your balance or have trouble walking.  You feel faint or pass out.  You have severe symptoms that are different from your first symptoms. MAKE SURE YOU:   Understand these instructions.  Will watch your condition.  Will get help right away if you are not doing well or get worse. Document Released: 02/28/2005 Document Revised: 07/15/2013 Document Reviewed: 11/05/2012 [facility] Patient Information 2015 [place], [place]. This information is not intended to replace advice given to you by your health care provider. Make sure you discuss any questions you have with your health care provider.

## 2013-12-18 LAB — SEDIMENTATION RATE: SED RATE: 2 mm/h (ref 0–40)

## 2013-12-18 LAB — SPECIMEN STATUS REPORT

## 2013-12-19 NOTE — Progress Notes (Signed)
Quick Note:  Left message that labs were unremarkable, per Dr. Anne HahnWillis. ______

## 2014-01-03 ENCOUNTER — Ambulatory Visit: Payer: Medicare PPO | Admitting: Neurology

## 2014-01-08 ENCOUNTER — Ambulatory Visit: Payer: Medicare PPO | Admitting: Neurology

## 2014-01-29 ENCOUNTER — Encounter: Payer: Self-pay | Admitting: Neurology

## 2014-02-04 ENCOUNTER — Encounter: Payer: Self-pay | Admitting: Neurology

## 2014-06-18 ENCOUNTER — Encounter: Payer: Self-pay | Admitting: Adult Health

## 2014-06-18 ENCOUNTER — Ambulatory Visit (INDEPENDENT_AMBULATORY_CARE_PROVIDER_SITE_OTHER): Payer: Medicare HMO | Admitting: Adult Health

## 2014-06-18 VITALS — BP 115/59 | HR 57 | Ht 62.0 in | Wt 117.0 lb

## 2014-06-18 DIAGNOSIS — Z8673 Personal history of transient ischemic attack (TIA), and cerebral infarction without residual deficits: Secondary | ICD-10-CM | POA: Diagnosis not present

## 2014-06-18 DIAGNOSIS — H9312 Tinnitus, left ear: Secondary | ICD-10-CM | POA: Diagnosis not present

## 2014-06-18 DIAGNOSIS — M542 Cervicalgia: Secondary | ICD-10-CM | POA: Diagnosis not present

## 2014-06-18 DIAGNOSIS — G43009 Migraine without aura, not intractable, without status migrainosus: Secondary | ICD-10-CM | POA: Diagnosis not present

## 2014-06-18 NOTE — Patient Instructions (Signed)
Overall you are doing well.  Continue plavix. Continue Cambia if needed for migraine.  Maintain strict control of blood pressure and cholesterol.  If you have any stroke like symptoms call 911 immediately.

## 2014-06-18 NOTE — Progress Notes (Signed)
PATIENT: Lauren Sanders DOB: [birth year]  REASON FOR VISIT: follow up- migraine, stroke, pulsatile tinnitus.  HISTORY FROM: patient                                           HISTORY OF PRESENT ILLNESS: Ms. Lauren Sanders is a 69 year old female with a history of a prior left thalamic stroke, migraines, pulsatile tinnitus and neck pain. She returns today for follow-up. The patient states that she is doing well. She has continued to take Plavix for stroke prevention. She states that her blood pressure and cholesterol have been in good control. She denies any strokelike symptoms. Patient states that she has not had a migraine since her stroke. She may occasionally have a mild headache. In the past she has used [place] with good benefit. Patient states that her neck pain has also improved. She states that occasionally she'll have some mild soreness on the left side but she can use Advil with good relief. Patient also reports ongoing pulsatile tinnitus in the left ear. This is been going on for sometime. She states that she notices it most with increased activity. She denies any new neurological symptoms. Denies any new medical issues. She returns today for evaluation.  HISTORY 12/17/13 (WILLIS): Ms. Lauren Sanders is a 69 year old right-handed white female with a history of a prior left thalamic stroke. The patient does have a history of migraine headache, but she has done well with this. Within the last 4 weeks, the patient has developed a right occipital headache that has spread to the left side, associated with scalp tenderness, neck discomfort on the left, stiffness of the neck, and some tingling in the back of the head. The patient believes that the headache has gradually improved a bit over time. She has not had any discomfort down the arms, she denies any problems with weakness of the extremities, she has not had any sinus drainage, vision disturbance, gait disturbance, or confusion. She reports no numbness of the  extremities. She was given a trial on doxycycline without benefit. She returns to this office for an evaluation. She also reports significant issues with constipation that has been present for the last couple months.  REVIEW OF SYSTEMS: Out of a complete 14 system review of symptoms, the patient complains only of the following symptoms, and all other reviewed systems are negative.  Ringing in ears, memory loss  ALLERGIES: No Known Allergies  HOME MEDICATIONS: Outpatient Prescriptions Prior to Visit  Medication Sig Dispense Refill  . atorvastatin (LIPITOR) 20 MG tablet Take 20 mg by mouth daily.    . Cholecalciferol (VITAMIN D-3) 5000 UNITS TABS Take 1 tablet by mouth daily.    . clopidogrel (PLAVIX) 75 MG tablet Take 75 mg by mouth daily.     . Diclofenac Potassium (CAMBIA) 50 MG PACK Take 1 each by mouth daily as needed. Pain    . fish oil-omega-3 fatty acids 1000 MG capsule Take 2 g by mouth daily.    . folic acid (FOLVITE) 400 MCG tablet Take 400 mcg by mouth daily.    . lansoprazole (PREVACID) 30 MG capsule TAKE ONE CAPSULE EVERY DAY 30 capsule 6  . losartan (COZAAR) 50 MG tablet Take 50 mg by mouth daily.    . pantoprazole (PROTONIX) 40 MG tablet     . verapamil (VERELAN PM) 240 MG 24 hr capsule Take 240 mg by mouth  daily.     No facility-administered medications prior to visit.    PAST MEDICAL HISTORY: Past Medical History  Diagnosis Date  . Migraine with aura   . Hypercholesteremia   . Arthritis   . TIA (transient ischemic attack)   . Rheumatic fever     As a child.  . Stroke     PAST SURGICAL HISTORY: Past Surgical History  Procedure Laterality Date  . Abdominal hysterectomy    . Bladder tacking      FAMILY HISTORY: History reviewed. No pertinent family history.  SOCIAL HISTORY: History   Social History  . Marital Status: Married    Spouse Name: Marcy Salvo   . Number of Children: 3  . Years of Education: GED   Occupational History  .  Other    Retired     Social History Main Topics  . Smoking status: Never Smoker   . Smokeless tobacco: Never Used  . Alcohol Use: No  . Drug Use: No  . Sexual Activity: Not on file   Other Topics Concern  . Not on file   Social History Narrative   Patient lives at home with husband Marcy Salvo.    Patient has 3 children.    Patient has a GED.    Patient is right handed.    Patient is retired.       PHYSICAL EXAM  Filed Vitals:   06/18/14 0821  BP: 115/59  Pulse: 57  Height:  (1.575 m)  Weight: 117 lb (53.071 kg)   Body mass index is 21.39 kg/(m^2).  Generalized: Well developed, in no acute distress   Neurological examination  Mentation: Alert oriented to time, place, history taking. Follows all commands speech and language fluent Cranial nerve II-XII: Pupils were equal round reactive to light. Extraocular movements were full, visual field were full on confrontational test. Facial sensation and strength were normal. Uvula tongue midline. Head turning and shoulder shrug  were normal and symmetric. Motor: The motor testing reveals 5 over 5 strength of all 4 extremities. Good symmetric motor tone is noted throughout.  Sensory: Sensory testing is intact to soft touch on all 4 extremities. No evidence of extinction is noted.  Coordination: Cerebellar testing reveals good finger-nose-finger and heel-to-shin bilaterally.  Gait and station: Gait is normal. Tandem gait is slightly unsteady initially but improves.. Romberg is negative. No drift is seen.  Reflexes: Deep tendon reflexes are symmetric and normal bilaterally.  Marland Kitchen   DIAGNOSTIC DATA (LABS, IMAGING, TESTING) - I reviewed patient records, labs, notes, testing and imaging myself where available.    ASSESSMENT AND PLAN 69 y.o. year old female  has a past medical history of Migraine with aura; Hypercholesteremia; Arthritis; TIA (transient ischemic attack); Rheumatic fever; and Stroke. here with:  1. Migraine 2. History of stroke 3.  Pulsatile tinnitus 4. Neck pain  Overall the patient is doing well. She will continue Plavix for stroke prevention. She was advised to maintain strict control of her blood pressure and cholesterol. The patient's migraines have been controlled. She is not had a migraine since her stroke. The patient's neck pain has also improved. She continues to have pulsatile tinnitus with activity. Patient advised that if her symptoms worsen or she develops new symptoms she she'll let us know. Otherwise she will follow-up with [place] in 1 year or sooner if needed.     Butch Penny, MSN, NP-C 06/18/2014, 8:44 AM [facility] [address] [place], [place] [address] [phone]  Note:  This document was prepared with digital dictation and possible smart phrase technology. Any transcriptional errors that result from this process are unintentional.

## 2014-06-18 NOTE — Progress Notes (Signed)
I have read the note, and I agree with the clinical assessment and plan.  Kaniesha Barile KEITH   

## 2015-06-17 ENCOUNTER — Encounter: Payer: Self-pay | Admitting: Adult Health

## 2015-06-17 ENCOUNTER — Ambulatory Visit (INDEPENDENT_AMBULATORY_CARE_PROVIDER_SITE_OTHER): Payer: Medicare HMO | Admitting: Adult Health

## 2015-06-17 VITALS — BP 119/59 | HR 56 | Resp 14 | Ht 62.0 in | Wt 116.0 lb

## 2015-06-17 DIAGNOSIS — Z8673 Personal history of transient ischemic attack (TIA), and cerebral infarction without residual deficits: Secondary | ICD-10-CM

## 2015-06-17 DIAGNOSIS — G43109 Migraine with aura, not intractable, without status migrainosus: Secondary | ICD-10-CM | POA: Diagnosis not present

## 2015-06-17 NOTE — Progress Notes (Signed)
PATIENT: Lauren Sanders DOB: 06/30/1945  REASON FOR VISIT: follow up- stroke, migraines, pulsatile tinnitus HISTORY FROM: patient  HISTORY OF PRESENT ILLNESS: Lauren Sanders is a 70 year old female with a history of stroke, migraines and pulsatile tinnitus in the left ear. She returns today for follow-up. The patient states that she had her first headache in one year. Her headache occurred in March. She states that it was very mild but it presented with changes in her vision. She saw spots in the right periphery. She then developed a mild headache that was primarily on the left side. She denies photophobia and phonophobia. Denies nausea and vomiting. She rates her pain as a 2 out of 10. She states that she did take [place] and her headache resolved within a couple of hours. She states during the headache she did try to read from the newspaper and words that she was trying to say came out differently- this is only occurred during the headache. She denies any facial droop or weakness in the extremities.. She does feel that during her headaches the pulsatile tinnitus is increased. She has continued on Plavix for stroke prevention. Blood pressure is in normal range. She reports that her primary care has been checking her cholesterol. She is not diabetic. She does not smoke cigarettes. She denies any new neurological symptoms. She returns today for an evaluation.  HISTORY 06/18/14:Lauren Sanders is a 70 year old female with a history of a prior left thalamic stroke, migraines, pulsatile tinnitus and neck pain. She returns today for follow-up. The patient states that she is doing well. She has continued to take Plavix for stroke prevention. She states that her blood pressure and cholesterol have been in good control. She denies any strokelike symptoms. Patient states that she has not had a migraine since her stroke. She may occasionally have a mild headache. In the past she has used [place] with good benefit. Patient  states that her neck pain has also improved. She states that occasionally she'll have some mild soreness on the left side but she can use Advil with good relief. Patient also reports ongoing pulsatile tinnitus in the left ear. This is been going on for sometime. She states that she notices it most with increased activity. She denies any new neurological symptoms. Denies any new medical issues. She returns today for evaluation.  HISTORY 12/17/13 (WILLIS): Lauren Sanders is a 70 year old right-handed white female with a history of a prior left thalamic stroke. The patient does have a history of migraine headache, but she has done well with this. Within the last 4 weeks, the patient has developed a right occipital headache that has spread to the left side, associated with scalp tenderness, neck discomfort on the left, stiffness of the neck, and some tingling in the back of the head. The patient believes that the headache has gradually improved a bit over time. She has not had any discomfort down the arms, she denies any problems with weakness of the extremities, she has not had any sinus drainage, vision disturbance, gait disturbance, or confusion. She reports no numbness of the extremities. She was given a trial on doxycycline without benefit. She returns to this office for an evaluation. She also reports significant issues with constipation that has been present for the last couple months.   REVIEW OF SYSTEMS: Out of a complete 14 system review of symptoms, the patient complains only of the following symptoms, and all other reviewed systems are negative.  Memory loss  ALLERGIES: No Known  Allergies  HOME MEDICATIONS: Outpatient Prescriptions Prior to Visit  Medication Sig Dispense Refill  . atorvastatin (LIPITOR) 20 MG tablet Take 20 mg by mouth daily.    . Cholecalciferol (VITAMIN D-3) 5000 UNITS TABS Take 1 tablet by mouth daily.    . clopidogrel (PLAVIX) 75 MG tablet Take 75 mg by mouth daily.     .  Diclofenac Potassium (CAMBIA) 50 MG PACK Take 1 each by mouth daily as needed. Pain    . fish oil-omega-3 fatty acids 1000 MG capsule Take 2 g by mouth daily.    . folic acid (FOLVITE) 400 MCG tablet Take 400 mcg by mouth daily.    . lansoprazole (PREVACID) 30 MG capsule TAKE ONE CAPSULE EVERY DAY 30 capsule 6  . losartan (COZAAR) 50 MG tablet Take 50 mg by mouth daily.    . pantoprazole (PROTONIX) 40 MG tablet     . verapamil (VERELAN PM) 240 MG 24 hr capsule Take 240 mg by mouth daily.     No facility-administered medications prior to visit.    PAST MEDICAL HISTORY: Past Medical History  Diagnosis Date  . Migraine with aura   . Hypercholesteremia   . Arthritis   . TIA (transient ischemic attack)   . Rheumatic fever     As a child.  . Stroke [facility](HCC)     PAST SURGICAL HISTORY: Past Surgical History  Procedure Laterality Date  . Abdominal hysterectomy    . Bladder tacking      FAMILY HISTORY: History reviewed. No pertinent family history.  SOCIAL HISTORY: Social History   Social History  . Marital Status: Married    Spouse Name: Marcy SalvoRaymond   . Number of Children: 3  . Years of Education: GED   Occupational History  .  Other    Retired   Social History Main Topics  . Smoking status: Never Smoker   . Smokeless tobacco: Never Used  . Alcohol Use: No  . Drug Use: No  . Sexual Activity: Not on file   Other Topics Concern  . Not on file   Social History Narrative   Patient lives at home with husband Marcy SalvoRaymond.    Patient has 3 children.    Patient has a GED.    Patient is right handed.    Patient is retired.       PHYSICAL EXAM  Filed Vitals:   06/17/15 0756  BP: 119/59  Pulse: 56  Resp: 14  Height: 5\' 2"  (1.575 m)  Weight: 116 lb (52.617 kg)   Body mass index is 21.21 kg/(m^2).  Generalized: Well developed, in no acute distress   Neurological examination  Mentation: Alert oriented to time, place, history taking. Follows all commands speech and  language fluent Cranial nerve II-XII: Pupils were equal round reactive to light. Extraocular movements were full, visual field were full on confrontational test. Facial sensation and strength were normal. Uvula tongue midline. Head turning and shoulder shrug  were normal and symmetric. Motor: The motor testing reveals 5 over 5 strength of all 4 extremities. Good symmetric motor tone is noted throughout.  Sensory: Sensory testing is intact to soft touch on all 4 extremities. No evidence of extinction is noted.  Coordination: Cerebellar testing reveals good finger-nose-finger and heel-to-shin bilaterally.  Gait and station: Gait is normal. Tandem gait is Slightly unsteady. Romberg is negative. No drift is seen.  Reflexes: Deep tendon reflexes are symmetric and normal bilaterally.   DIAGNOSTIC DATA (LABS, IMAGING, TESTING) - I reviewed patient records,  labs, notes, testing and imaging myself where available.   ASSESSMENT AND PLAN 70 y.o. year old female  has a past medical history of Migraine with aura; Hypercholesteremia; Arthritis; TIA (transient ischemic attack); Rheumatic fever; and Stroke (HCC). here with:  1. Migraine headaches 2. Stroke  Patient has had 1 mild headache in the last year. Patient is advised that if her headache frequency or severity increases she should let us know. For now we will continue to monitor. Patient should remain on Plavix for stroke prevention. She should maintain strict control of her blood pressure goal less than 130/90. Cholesterol LDL less than 100. I reviewed symptoms of stroke with the patient and her husband. Patient is advised that if she develops any strokelike symptoms she should call 911 immediately.  Butch Penny, MSN, NP-C 06/17/2015, 8:05 AM [facility] [address] [place], [place] [address] [phone]

## 2015-06-17 NOTE — Progress Notes (Signed)
I have read the note, and I agree with the clinical assessment and plan.  Payten Hobin KEITH   

## 2015-06-17 NOTE — Patient Instructions (Addendum)
Continue plavix for stroke prevention Maintain good control of blood pressure goal <130/90 Cholesterol LDL <100 If headache frequency increases let us know If you develop any strokelike symptoms call 911 immediately.

## 2015-06-18 ENCOUNTER — Ambulatory Visit: Payer: Medicare HMO | Admitting: Adult Health

## 2016-06-16 ENCOUNTER — Encounter: Payer: Self-pay | Admitting: Neurology

## 2016-06-16 ENCOUNTER — Encounter (INDEPENDENT_AMBULATORY_CARE_PROVIDER_SITE_OTHER): Payer: Self-pay

## 2016-06-16 ENCOUNTER — Ambulatory Visit (INDEPENDENT_AMBULATORY_CARE_PROVIDER_SITE_OTHER): Payer: Medicare HMO | Admitting: Neurology

## 2016-06-16 VITALS — BP 129/67 | HR 59 | Ht 62.0 in | Wt 118.0 lb

## 2016-06-16 DIAGNOSIS — G43709 Chronic migraine without aura, not intractable, without status migrainosus: Secondary | ICD-10-CM

## 2016-06-16 NOTE — Progress Notes (Signed)
Reason for visit: Headaches  Lauren Sanders is an 71 y.o. female  History of present illness:  Lauren Sanders is a 71 year old right-handed white female with a history of cerebrovascular disease with a prior left thalamic stroke. The patient has a history of migraine, sometimes associated with cognitive changes. Her headaches have not been a big issue since last seen, she has only had an occasional mild dull achy headache that responds to Tylenol. The patient has Cambia to use if needed. The patient is on Plavix for stroke prevention. The patient does note a mild problem with memory, she does not believe that the memory has progressed over the last year. She is not on any medications for memory. She believes that the memory problem may have started around the time of her left thalamic stroke. The patient does report some mild neck discomfort, occasionally she might have a sharp shooting pain in the back of the neck.  Past Medical History:  Diagnosis Date  . Arthritis   . Hypercholesteremia   . Migraine with aura   . Rheumatic fever    As a child.  . Stroke (HCC)   . TIA (transient ischemic attack)     Past Surgical History:  Procedure Laterality Date  . ABDOMINAL HYSTERECTOMY    . bladder tacking      History reviewed. No pertinent family history.  Social history:  reports that she has never smoked. She has never used smokeless tobacco. She reports that she does not drink alcohol or use drugs.   No Known Allergies  Medications:  Prior to Admission medications   Medication Sig Start Date End Date Taking? Authorizing Provider  atorvastatin (LIPITOR) 20 MG tablet Take 20 mg by mouth daily.   Yes Historical Provider, MD  Cholecalciferol (VITAMIN D-3) 5000 UNITS TABS Take 1 tablet by mouth daily.   Yes Historical Provider, MD  clopidogrel (PLAVIX) 75 MG tablet Take 75 mg by mouth daily.  06/25/12  Yes Historical Provider, MD  Diclofenac Potassium (CAMBIA) 50 MG PACK Take 1 each by  mouth daily as needed. Pain   Yes Historical Provider, MD  fish oil-omega-3 fatty acids 1000 MG capsule Take 2 g by mouth daily.   Yes Historical Provider, MD  folic acid (FOLVITE) 400 MCG tablet Take 400 mcg by mouth daily.   Yes Historical Provider, MD  lansoprazole (PREVACID) 30 MG capsule TAKE ONE CAPSULE EVERY DAY 02/28/13  Yes [name], MD  losartan (COZAAR) 50 MG tablet Take 50 mg by mouth daily.   Yes Historical Provider, MD  pantoprazole (PROTONIX) 40 MG tablet  11/24/13  Yes Historical Provider, MD  verapamil (VERELAN PM) 240 MG 24 hr capsule Take 240 mg by mouth daily.   Yes Historical Provider, MD    ROS:  Out of a complete 14 system review of symptoms, the patient complains only of the following symptoms, and all other reviewed systems are negative.  Memory loss  Blood pressure 129/67, pulse (!) 59, height  (1.575 m), weight 118 lb (53.5 kg).  Physical Exam  General: The patient is alert and cooperative at the time of the examination.  Skin: No significant peripheral edema is noted.   Neurologic Exam  Mental status: The patient is alert and oriented x 3 at the time of the examination. The patient has apparent normal recent and remote memory, with an apparently normal attention span and concentration ability. Mini-Mental Status Examination done today shows a total score of 29/30. The  patient is able to name 10 animals in 30 seconds.   Cranial nerves: Facial symmetry is present. Speech is normal, no aphasia or dysarthria is noted. Extraocular movements are full. Visual fields are full.  Motor: The patient has good strength in all 4 extremities.  Sensory examination: Soft touch sensation is symmetric on the face, arms, and legs.  Coordination: The patient has good finger-nose-finger and heel-to-shin bilaterally.  Gait and station: The patient has a normal gait. Tandem gait is normal. Romberg is negative. No drift is seen.  Reflexes: Deep tendon reflexes  are symmetric.   Assessment/Plan:  1. History of cerebrovascular disease  2. History of migraine headache  3. Mild memory disturbance  At this point, our office is not prescribing any medications for this patient. She appears to be relatively stable, we will see her back on an as-needed basis, she will call our office if any issues arise. All medications are coming through her primary care physician.   Marlan Palau MD 06/16/2016 8:04 AM  [facility] [address] [place], [place] [address]  Phone [phone] Fax [phone]

## 2017-02-07 ENCOUNTER — Encounter (HOSPITAL_COMMUNITY): Payer: Self-pay

## 2017-02-07 ENCOUNTER — Inpatient Hospital Stay (HOSPITAL_COMMUNITY): Payer: Medicare HMO

## 2017-02-07 ENCOUNTER — Emergency Department (HOSPITAL_COMMUNITY): Payer: Medicare HMO

## 2017-02-07 ENCOUNTER — Observation Stay (HOSPITAL_COMMUNITY)
Admission: EM | Admit: 2017-02-07 | Discharge: 2017-02-08 | Disposition: A | Discharge status: Home / Self Care | Payer: Medicare HMO | Attending: Family Medicine | Admitting: Family Medicine

## 2017-02-07 ENCOUNTER — Other Ambulatory Visit: Payer: Self-pay

## 2017-02-07 DIAGNOSIS — R410 Disorientation, unspecified: Secondary | ICD-10-CM | POA: Diagnosis not present

## 2017-02-07 DIAGNOSIS — E872 Acidosis, unspecified: Secondary | ICD-10-CM

## 2017-02-07 DIAGNOSIS — G9340 Encephalopathy, unspecified: Secondary | ICD-10-CM | POA: Insufficient documentation

## 2017-02-07 DIAGNOSIS — E785 Hyperlipidemia, unspecified: Secondary | ICD-10-CM | POA: Diagnosis not present

## 2017-02-07 DIAGNOSIS — Z7901 Long term (current) use of anticoagulants: Secondary | ICD-10-CM | POA: Diagnosis not present

## 2017-02-07 DIAGNOSIS — E782 Mixed hyperlipidemia: Secondary | ICD-10-CM | POA: Diagnosis not present

## 2017-02-07 DIAGNOSIS — M6281 Muscle weakness (generalized): Secondary | ICD-10-CM | POA: Diagnosis not present

## 2017-02-07 DIAGNOSIS — E7800 Pure hypercholesterolemia, unspecified: Secondary | ICD-10-CM | POA: Insufficient documentation

## 2017-02-07 DIAGNOSIS — Z8673 Personal history of transient ischemic attack (TIA), and cerebral infarction without residual deficits: Secondary | ICD-10-CM | POA: Insufficient documentation

## 2017-02-07 DIAGNOSIS — I639 Cerebral infarction, unspecified: Secondary | ICD-10-CM

## 2017-02-07 DIAGNOSIS — Z79899 Other long term (current) drug therapy: Secondary | ICD-10-CM | POA: Insufficient documentation

## 2017-02-07 DIAGNOSIS — I361 Nonrheumatic tricuspid (valve) insufficiency: Secondary | ICD-10-CM

## 2017-02-07 DIAGNOSIS — R4182 Altered mental status, unspecified: Secondary | ICD-10-CM | POA: Diagnosis present

## 2017-02-07 LAB — URINALYSIS, COMPLETE (UACMP) WITH MICROSCOPIC
BILIRUBIN URINE: NEGATIVE
Glucose, UA: NEGATIVE mg/dL
HGB URINE DIPSTICK: NEGATIVE
Ketones, ur: NEGATIVE mg/dL
LEUKOCYTES UA: NEGATIVE
NITRITE: NEGATIVE
PROTEIN: NEGATIVE mg/dL
Specific Gravity, Urine: 1.014 (ref 1.005–1.030)
pH: 6 (ref 5.0–8.0)

## 2017-02-07 LAB — CBC
HEMATOCRIT: 43.5 % (ref 36.0–46.0)
Hemoglobin: 14.1 g/dL (ref 12.0–15.0)
MCH: 29.9 pg (ref 26.0–34.0)
MCHC: 32.4 g/dL (ref 30.0–36.0)
MCV: 92.2 fL (ref 78.0–100.0)
Platelets: 281 10*3/uL (ref 150–400)
RBC: 4.72 MIL/uL (ref 3.87–5.11)
RDW: 12.9 % (ref 11.5–15.5)
WBC: 6.8 10*3/uL (ref 4.0–10.5)

## 2017-02-07 LAB — COMPREHENSIVE METABOLIC PANEL
ALT: 16 U/L (ref 14–54)
ANION GAP: 9 (ref 5–15)
AST: 22 U/L (ref 15–41)
Albumin: 4.6 g/dL (ref 3.5–5.0)
Alkaline Phosphatase: 58 U/L (ref 38–126)
BUN: 18 mg/dL (ref 6–20)
CHLORIDE: 105 mmol/L (ref 101–111)
CO2: 25 mmol/L (ref 22–32)
CREATININE: 1.08 mg/dL — AB (ref 0.44–1.00)
Calcium: 9.6 mg/dL (ref 8.9–10.3)
GFR, EST AFRICAN AMERICAN: 58 mL/min — AB (ref >60)
GFR, EST NON AFRICAN AMERICAN: 50 mL/min — AB (ref >60)
Glucose, Bld: 95 mg/dL (ref 65–99)
POTASSIUM: 3.6 mmol/L (ref 3.5–5.1)
Sodium: 139 mmol/L (ref 135–145)
TOTAL PROTEIN: 7.5 g/dL (ref 6.5–8.1)
Total Bilirubin: 1 mg/dL (ref 0.3–1.2)

## 2017-02-07 LAB — URINALYSIS, ROUTINE W REFLEX MICROSCOPIC
Bilirubin Urine: NEGATIVE
Glucose, UA: NEGATIVE mg/dL
HGB URINE DIPSTICK: NEGATIVE
Ketones, ur: NEGATIVE mg/dL
LEUKOCYTES UA: NEGATIVE
Nitrite: NEGATIVE
PROTEIN: NEGATIVE mg/dL
SPECIFIC GRAVITY, URINE: 1.004 — AB (ref 1.005–1.030)
pH: 6 (ref 5.0–8.0)

## 2017-02-07 LAB — I-STAT CG4 LACTIC ACID, ED: LACTIC ACID, VENOUS: 2.32 mmol/L — AB (ref 0.5–1.9)

## 2017-02-07 LAB — VITAMIN B12: VITAMIN B 12: 213 pg/mL (ref 180–914)

## 2017-02-07 LAB — I-STAT CHEM 8, ED
BUN: 17 mg/dL (ref 6–20)
Calcium, Ion: 1.17 mmol/L (ref 1.15–1.40)
Chloride: 107 mmol/L (ref 101–111)
Creatinine, Ser: 1 mg/dL (ref 0.44–1.00)
GLUCOSE: 94 mg/dL (ref 65–99)
HEMATOCRIT: 40 % (ref 36.0–46.0)
HEMOGLOBIN: 13.6 g/dL (ref 12.0–15.0)
POTASSIUM: 3.8 mmol/L (ref 3.5–5.1)
Sodium: 144 mmol/L (ref 135–145)
TCO2: 24 mmol/L (ref 22–32)

## 2017-02-07 LAB — RAPID URINE DRUG SCREEN, HOSP PERFORMED
Amphetamines: NOT DETECTED
BENZODIAZEPINES: NOT DETECTED
Barbiturates: NOT DETECTED
Cocaine: NOT DETECTED
Opiates: NOT DETECTED
Tetrahydrocannabinol: NOT DETECTED

## 2017-02-07 LAB — DIFFERENTIAL
BASOS ABS: 0 10*3/uL (ref 0.0–0.1)
BASOS PCT: 1 %
EOS ABS: 0.1 10*3/uL (ref 0.0–0.7)
EOS PCT: 2 %
Lymphocytes Relative: 27 %
Lymphs Abs: 1.8 10*3/uL (ref 0.7–4.0)
MONO ABS: 0.4 10*3/uL (ref 0.1–1.0)
MONOS PCT: 6 %
Neutro Abs: 4.4 10*3/uL (ref 1.7–7.7)
Neutrophils Relative %: 64 %

## 2017-02-07 LAB — PROTIME-INR
INR: 0.91
Prothrombin Time: 12.2 seconds (ref 11.4–15.2)

## 2017-02-07 LAB — TSH: TSH: 3.53 u[IU]/mL (ref 0.350–4.500)

## 2017-02-07 LAB — T4, FREE: FREE T4: 0.87 ng/dL (ref 0.61–1.12)

## 2017-02-07 LAB — I-STAT TROPONIN, ED: TROPONIN I, POC: 0 ng/mL (ref 0.00–0.08)

## 2017-02-07 LAB — ETHANOL

## 2017-02-07 LAB — APTT: APTT: 25 s (ref 24–36)

## 2017-02-07 LAB — ECHOCARDIOGRAM COMPLETE
Height: 62 in
WEIGHTICAEL: 1888 [oz_av]

## 2017-02-07 LAB — AMMONIA: AMMONIA: 9 umol/L (ref 9–35)

## 2017-02-07 MED ORDER — ENOXAPARIN SODIUM 40 MG/0.4ML ~~LOC~~ SOLN
40.0000 mg | SUBCUTANEOUS | Status: DC
  Administered 2017-02-07: 40 mg via SUBCUTANEOUS
  Filled 2017-02-07: qty 0.4

## 2017-02-07 MED ORDER — ACETAMINOPHEN 650 MG RE SUPP: 650.0000 mg | RECTAL | Status: DC | PRN

## 2017-02-07 MED ORDER — CLOPIDOGREL BISULFATE 75 MG PO TABS
75.0000 mg | ORAL_TABLET | Freq: Every day | ORAL | Status: DC
  Administered 2017-02-07 – 2017-02-08 (×2): 75 mg via ORAL
  Filled 2017-02-07 (×2): qty 1

## 2017-02-07 MED ORDER — ACETAMINOPHEN 160 MG/5ML PO SOLN
650.0000 mg | ORAL | Status: DC | PRN
Start: 2017-02-07 — End: 2017-02-08

## 2017-02-07 MED ORDER — FOLIC ACID 1 MG PO TABS
500.0000 ug | ORAL_TABLET | Freq: Every day | ORAL | Status: DC
  Administered 2017-02-07 – 2017-02-08 (×2): 0.5 mg via ORAL
  Filled 2017-02-07 (×4): qty 1

## 2017-02-07 MED ORDER — ATORVASTATIN CALCIUM 20 MG PO TABS
20.0000 mg | ORAL_TABLET | Freq: Every day | ORAL | Status: DC
  Administered 2017-02-07 – 2017-02-08 (×2): 20 mg via ORAL
  Filled 2017-02-07 (×2): qty 1

## 2017-02-07 MED ORDER — OMEGA-3-ACID ETHYL ESTERS 1 G PO CAPS
2.0000 g | ORAL_CAPSULE | Freq: Every day | ORAL | Status: DC
  Administered 2017-02-07: 2 g via ORAL
  Filled 2017-02-07: qty 2

## 2017-02-07 MED ORDER — ACETAMINOPHEN 325 MG PO TABS: 650.0000 mg | ORAL_TABLET | ORAL | Status: DC | PRN

## 2017-02-07 MED ORDER — POTASSIUM CHLORIDE IN NACL 20-0.9 MEQ/L-% IV SOLN
INTRAVENOUS | Status: DC
  Administered 2017-02-07 – 2017-02-08 (×2): via INTRAVENOUS

## 2017-02-07 MED ORDER — STROKE: EARLY STAGES OF RECOVERY BOOK
Freq: Once | Status: AC
  Administered 2017-02-07: 15:00:00
  Filled 2017-02-07: qty 1

## 2017-02-07 MED ORDER — SENNOSIDES-DOCUSATE SODIUM 8.6-50 MG PO TABS: 1.0000 | ORAL_TABLET | Freq: Every evening | ORAL | Status: DC | PRN

## 2017-02-07 MED ORDER — VITAMIN D 1000 UNITS PO TABS
ORAL_TABLET | Freq: Every day | ORAL | Status: DC
  Administered 2017-02-07 – 2017-02-08 (×2): 1000 [IU] via ORAL
  Filled 2017-02-07 (×2): qty 1

## 2017-02-07 MED ORDER — PANTOPRAZOLE SODIUM 40 MG PO TBEC
40.0000 mg | DELAYED_RELEASE_TABLET | Freq: Every day | ORAL | Status: DC
  Administered 2017-02-07 – 2017-02-08 (×2): 40 mg via ORAL
  Filled 2017-02-07 (×2): qty 1

## 2017-02-07 MED ORDER — SODIUM CHLORIDE 0.9 % IV SOLN: INTRAVENOUS | Status: DC

## 2017-02-07 NOTE — ED Notes (Signed)
Pt returned from CT °

## 2017-02-07 NOTE — Progress Notes (Signed)
SLP Cancellation Note  Patient Details Name: Lauren Sanders MRN: [id number] DOB: Mar 18, 1945   Cancelled treatment:       Reason Eval/Treat Not Completed: SLP screened, no needs identified, will sign off; SLP screened Pt in room. Pt denies any changes in swallowing, speech, language, or cognition. Family in agreement. MRI showed chronic right anterior basal ganglia lacunar infarct. SLE will be deferred at this time. Reconsult if indicated. SLP will sign off.   Thank you,  Havery MorosDabney Jazline Cumbee, CCC-SLP [phone]  Julia Kulzer 02/07/2017, 5:16 PM

## 2017-02-07 NOTE — ED Notes (Signed)
Pt refused in and out cath. Pt ambulated to bathroom with minimal assistance and provided clean catch urine specimen.

## 2017-02-07 NOTE — Progress Notes (Signed)
Respiratory Care Note: EKG done and placed in the patient's chart. RN aware.

## 2017-02-07 NOTE — Progress Notes (Signed)
*  PRELIMINARY RESULTS* Echocardiogram 2D Echocardiogram has been performed.  Stacey DrainWhite, Annessa Satre J 02/07/2017, 3:50 PM

## 2017-02-07 NOTE — ED Notes (Signed)
Verlon AuLeslie RN with pt to CT.

## 2017-02-07 NOTE — ED Triage Notes (Signed)
edp at bedside  

## 2017-02-07 NOTE — ED Notes (Addendum)
Hourly round at 0900, [facility] at 0902 and primary assessment at 0904 was done by Doretha Sou Evvie Behrmann RN, accidentally charted under L Arts administratorCardwell RN.

## 2017-02-07 NOTE — ED Triage Notes (Signed)
Per pt's spouse, pt woke up at 7am and was normal.  Reports at 8am he noticed her being confused.   Pt alert, denies pain or dizziness.  Pt oriented to self and knows she is at the hospital but though she was in [place].  EDP notified.

## 2017-02-07 NOTE — Consult Note (Signed)
   [facility] Tele[facility]urology Consult Services  Impression:  acute onset confusion - encephalopathy favored over stroke. Recommend admission for metabolic/infectious workup and stroke workup.  - - -   Not a tpa candidate due to: NIHSS 1 with nondisabling symptoms Presentation is not suggestive of Large Vessel Occlusive Disease. Thrombectomy would not be recommended.   Differential Diagnosis:   1. Cardioembolic stroke  2. Small vessel disease/lacune  3. Thromboembolic, artery-to-artery mechanism  4. Hypercoagulable state-related infarct  5. Transient ischemic attack  6. Thrombotic mechanism, large artery disease   Comments:   Door time: 0855 [facility] contacted: 0909 [facility] at bedside: 0918 NIHSS assessment time: 0927  Recommendations:  tele DVT proph - lovenox permissive htn PT/OT/speech bedside swallow eval ASA if CT head is read as negative for hemorrhage - negative per my read.  Inpatient neurology consultation Inpatient stroke evaluation as per Neurology/ Internal Medicine Discussed with ED MD Please call with questions  -----------------------------------------------------------------------------------------  CC confusion  History of Present Illness   Patient is a 71 yo man who woke up at approx 0705 and was normal. She was noted to be confused by her husband at approx 0750. She didn't remember eating breakfast. She couldn't find simple things in the house. No trouble speaking/swallowing. No focal or lateralizing weakness. No LOC/convulsion.   Diagnostic: CT head neg for hemorrhage per my read.   Exam: RESULT SUMMARY: 1 points NIH Stroke Scale   INPUTS: 1A: Level of consciousness -> 0 = Alert; keenly responsive 1B: Ask month and age -> 1 = 1 question right 1C: 'Blink eyes' & 'squeeze hands' -> 0 = Performs both tasks 2: Horizontal extraocular movements -> 0 = Normal 3: Visual fields -> 0 = No visual loss 4: Facial palsy -> 0 = Normal  symmetry 5A: Left arm motor drift -> 0 = No drift for 10 seconds 5B: Right arm motor drift -> 0 = No drift for 10 seconds 6A: Left leg motor drift -> 0 = No drift for 5 seconds 6B: Right leg motor drift -> 0 = No drift for 5 seconds 7: Limb Ataxia -> 0 = No ataxia 8: Sensation -> 0 = Normal; no sensory loss 9: Language/aphasia -> 0 = Normal; no aphasia 10: Dysarthria -> 0 = Normal 11: Extinction/inattention -> 0 = No abnormality    Medical Decision Making:  - Extensive number of diagnosis or management options are considered above.   - Extensive amount of complex data reviewed.   - High risk of complication and/or morbidity or mortality are associated with differential diagnostic considerations above.  - There may be Uncertain outcome and increased probability of prolonged functional impairment or high probability of severe prolonged functional impairment associated with some of these differential diagnosis.  Medical Data Reviewed:  1.Data reviewed include clinical labs, radiology,  Medical Tests;   2.Tests results discussed w/performing or interpreting physician;   3.Obtaining/reviewing old medical records;  4.Obtaining case history from another source;  5.Independent review of image, tracing or specimen.    Patient was informed the Neurology Consult would happen via telehealth (remote video) and consented to receiving care in this manner.

## 2017-02-07 NOTE — H&P (Signed)
History and Physical  Lauren Sanders XBJ:[id number] DOB: 09-07-1945 DOA: 02/07/2017   PCP: Elise BennePradhan, Pradeep K, MD   Patient coming from: Home  Chief Complaint: confusion  HPI:  Lauren Sanders is a 71 y.o. female with medical history of stroke, hypertension, hyperlipidemia presenting with new onset of confusion that began around 8 AM on February 07, 2017.  The patient has been in her usual state of health up until the morning of admission.  The patient was normal when she woke up around 7 AM getting ready for breakfast.  Her husband stated that the patient became confused and suddenly did not remember where common things were located in their house.  He states that she was speaking clearly and understandably.  However, she was a bit repetitive in her speech.  There was no hallucinations noted.  There have not been any new medications.  Because of the confusion, the patient was brought to the emergency department for further evaluation.  Code stroke was activated.  The patient was seen by tele-neurology.  She was not a candidate for TPA.  They recommended admission for stroke workup.  Patient denies fevers, chills, headache, chest pain, dyspnea, nausea, vomiting, diarrhea, abdominal pain, dysuria, hematuria, hematochezia, and melena.  The patient denies any loss of vision, blurry vision, headache, focal extremity weakness, or sensory disturbance.  The patient knows the month, year, location, and her name, but she does not know why she is in the hospital.  In the emergency department, the patient was afebrile hemodynamically stable saturating 98-99% on room air.  BMP, LFTs, and CBC were unremarkable.  Lactic acid was at 2.32.  Alcohol level was negative.  Urinalysis and urine drug screen are pending at the time of my assessment.  CT of the brain showed age indeterminate but chronic appearing lacunar infarct in the right basal ganglia  Assessment/Plan: Acute encephalopathy -MRI brain -Serum  B12 -TSH -Ammonia -UA and urine culture  Repetitive speech -PT/OT evaluation -Speech therapy eval -CT brain-- age indeterminate but chronic appearing lacunar infarct in the right basal ganglia -MRI brain-- -MRA brain-- -Carotid Duplex-- -Echo-- -LDL-- -HbA1C-- -Antiplatelet--plavix  Essential hypertension -Allow for permissive hypertension in the first 24 hours -Hydralazine as needed SBP >220 -Holding verapamil and losartan  Hyperlipidemia -Continue statin  Lactic acidosis -Patient is afebrile hemodynamically stable without leukocytosis -UA and urine culture -Blood cultures x2 sets -IV fluids -Personally reviewed chest x-ray--no consolidation -Suspect volume depletion     Past Medical History:  Diagnosis Date  . Arthritis   . Hypercholesteremia   . Migraine with aura   . Rheumatic fever    As a child.  . Stroke (HCC)   . TIA (transient ischemic attack)    Past Surgical History:  Procedure Laterality Date  . ABDOMINAL HYSTERECTOMY    . bladder tacking     Social History:  reports that  has never smoked. she has never used smokeless tobacco. She reports that she does not drink alcohol or use drugs.  Family history--reviewed--no pertinent family history  No Known Allergies   Prior to Admission medications   Medication Sig Start Date End Date Taking? Authorizing Provider  atorvastatin (LIPITOR) 20 MG tablet Take 20 mg by mouth daily.   Yes [provider]  Cholecalciferol (VITAMIN D-3) 5000 UNITS TABS Take 1 tablet by mouth daily.   Yes [provider]  clopidogrel (PLAVIX) 75 MG tablet Take 75 mg by mouth daily.  06/25/12  Yes [provider]  Diclofenac  Potassium (CAMBIA) 50 MG PACK Take 1 each by mouth daily as needed. Pain   Yes [provider]  fish oil-omega-3 fatty acids 1000 MG capsule Take 2 g by mouth daily.   Yes [provider]  folic acid (FOLVITE) 400 MCG tablet Take 400 mcg by mouth daily.   Yes  [provider]  lansoprazole (PREVACID) 30 MG capsule TAKE ONE CAPSULE EVERY DAY 02/28/13  Yes York SpanielWillis, Charles K, MD  losartan (COZAAR) 50 MG tablet Take 50 mg by mouth daily.   Yes [provider]  pantoprazole (PROTONIX) 40 MG tablet  11/24/13  Yes [provider]  verapamil (VERELAN PM) 240 MG 24 hr capsule Take 240 mg by mouth daily.   Yes [provider]    Review of Systems:  Constitutional:  No weight loss, night sweats, Fevers, chills, fatigue.  Head&Eyes: No headache.  No vision loss.  No eye pain or scotoma ENT:  No Difficulty swallowing,Tooth/dental problems,Sore throat,  No ear ache, post nasal drip,  Cardio-vascular:  No chest pain, Orthopnea, PND, swelling in lower extremities,  dizziness, palpitations  GI:  No  abdominal pain, nausea, vomiting, diarrhea, loss of appetite, hematochezia, melena, heartburn, indigestion, Resp:  No shortness of breath with exertion or at rest. No cough. No coughing up of blood .No wheezing.No chest wall deformity  Skin:  no rash or lesions.  GU:  no dysuria, change in color of urine, no urgency or frequency. No flank pain.  Musculoskeletal:  No joint pain or swelling. No decreased range of motion. No back pain.  Psych:  No change in mood or affect. No depression or anxiety. Neurologic: No headache, no dysesthesia, no focal weakness, no vision loss. No syncope  Physical Exam: Vitals:   02/07/17 0945 02/07/17 1000 02/07/17 1015 02/07/17 1030  BP: 135/69 139/69 (!) 132/59 136/76  Pulse: 67 73 68 (!) 126  Resp: 16 20 13 19   Temp:      TempSrc:      SpO2: 99% 100% 100%   Weight:      Height:       General:  A&O x 3, NAD, nontoxic, pleasant/cooperative Head/Eye: No conjunctival hemorrhage, no icterus, [place]/AT, No nystagmus ENT:  No icterus,  No thrush, good dentition, no pharyngeal exudate Neck:  No masses, no lymphadenpathy, no bruits CV:  RRR, no rub, no gallop, no S3 Lung:  CTAB, good air  movement, no wheeze, no rhonchi Abdomen: soft/NT, +BS, nondistended, no peritoneal signs Ext: No cyanosis, No rashes, No petechiae, No lymphangitis, No edema Neuro: CNII-XII intact, strength 4/5 in bilateral upper and lower extremities, no dysmetria  Labs on Admission:  Basic Metabolic Panel: Recent Labs  Lab 02/07/17 0908 02/07/17 0915  NA 139 144  K 3.6 3.8  CL 105 107  CO2 25  --   GLUCOSE 95 94  BUN 18 17  CREATININE 1.08* 1.00  CALCIUM 9.6  --    Liver Function Tests: Recent Labs  Lab 02/07/17 0908  AST 22  ALT 16  ALKPHOS 58  BILITOT 1.0  PROT 7.5  ALBUMIN 4.6   No results for input(s): LIPASE, AMYLASE in the last 168 hours. No results for input(s): AMMONIA in the last 168 hours. CBC: Recent Labs  Lab 02/07/17 0908 02/07/17 0915  WBC 6.8  --   NEUTROABS 4.4  --   HGB 14.1 13.6  HCT 43.5 40.0  MCV 92.2  --   PLT 281  --    Coagulation Profile: Recent  Labs  Lab 02/07/17 0908  INR 0.91   Cardiac Enzymes: No results for input(s): CKTOTAL, CKMB, CKMBINDEX, TROPONINI in the last 168 hours. BNP: Invalid input(s): POCBNP CBG: No results for input(s): GLUCAP in the last 168 hours. Urine analysis:    Component Value Date/Time   COLORURINE YELLOW 02/07/2017 0908   APPEARANCEUR CLEAR 02/07/2017 0908   LABSPEC 1.004 (L) 02/07/2017 0908   PHURINE 6.0 02/07/2017 0908   GLUCOSEU NEGATIVE 02/07/2017 0908   HGBUR NEGATIVE 02/07/2017 0908   BILIRUBINUR NEGATIVE 02/07/2017 0908   KETONESUR NEGATIVE 02/07/2017 0908   PROTEINUR NEGATIVE 02/07/2017 0908   UROBILINOGEN 0.2 07/23/2011 1320   NITRITE NEGATIVE 02/07/2017 0908   LEUKOCYTESUR NEGATIVE 02/07/2017 0908   Sepsis Labs: @LABRCNTIP (procalcitonin:4,lacticidven:4) )No results found for this or any previous visit (from the past 240 hour(s)).   Radiological Exams on Admission: Dg Chest 2 View  Result Date: 02/07/2017 CLINICAL DATA:  Confusion, weakness EXAM: CHEST  2 VIEW COMPARISON:  07/01/2011  FINDINGS: Left base atelectasis. Right lung is clear. Heart is normal size. No effusions or acute bony abnormality. IMPRESSION: Left base atelectasis. Electronically Signed   By: Charlett Nose M.D.   On: 02/07/2017 10:10   Ct Head Code Stroke Wo Contrast  Result Date: 02/07/2017 CLINICAL DATA:  Code stroke. 71 year old female was normal upon waking at 0700 hours. Confusion noticed at 0800 hours. EXAM: CT HEAD WITHOUT CONTRAST TECHNIQUE: Contiguous axial images were obtained from the base of the skull through the vertex without intravenous contrast. COMPARISON:  Brain MRI 07/01/2011.  Intracranial MRA 09/13/2011. FINDINGS: Brain: Confluent hypodensity in the right caudate and anterior lentiform (series 2, image 12) is new since the 2013 comparison. Stable overall cerebral volume since 2013. Elsewhere Gray-white matter differentiation is within normal limits throughout the brain. No midline shift, ventriculomegaly, mass effect, evidence of mass lesion, intracranial hemorrhage or evidence of cortically based acute infarction. No cortical encephalomalacia identified. Vascular: Calcified atherosclerosis at the skull base. No suspicious intracranial vascular hyperdensity. Skull: Negative.  No acute osseous abnormality identified. Sinuses/Orbits: Clear. Other: Visualized orbits and scalp soft tissues are within normal limits. ASPECTS [facility] Stroke Program Early CT Score) - Ganglionic level infarction (caudate, lentiform nuclei, internal capsule, insula, M1-M3 cortex): 7 - Supraganglionic infarction (M4-M6 cortex): 3 Total score (0-10 with 10 being normal): 10 IMPRESSION: 1. Age indeterminate but chronic appearing lacunar infarct of the right basal ganglia, new since 2013. 2. No acute intracranial hemorrhage or cortically based infarct identified. 3. ASPECTS is 10. 4. Study discussed by telephone with Dr. Jomarie Longs ZAMMIT on 02/07/2017 at 09:30 . Electronically Signed   By: Odessa Fleming M.D.   On: 02/07/2017 09:31    EKG:  Independently reviewed.  Sinus rhythm, nonspecific T wave change    Time spent:60 minutes Code Status:   FULL Family Communication:  Spouse updated at bedside Disposition Plan: expect 2-3 day hospitalization Consults called: none DVT Prophylaxis: [place] Lovenox  Favor Hackler, DO  [facility] Pager [phone]  If 7PM-7AM, please contact night-coverage [email or web address] Password [email or web address] 02/07/2017, 10:45 AM

## 2017-02-07 NOTE — Progress Notes (Signed)
Pt off floor for MRI when 1340 neuro check is due.

## 2017-02-07 NOTE — Progress Notes (Signed)
CALL FROM RN 0902 BEEPER 0902 EXAM STARTED 0906 EXAM FINISHED 0910 COMPLETED/EPIC/SOC 0917 CALLED GR [phone]

## 2017-02-07 NOTE — ED Provider Notes (Signed)
[facility] EMERGENCY DEPARTMENT Provider Note   CSN: [id number] Arrival date & time: 02/07/17  4098   An emergency department physician performed an initial assessment on this suspected stroke patient at 0905.  History   Chief Complaint Chief Complaint  Patient presents with  . Altered Mental Status    HPI Lauren Sanders is a 71 y.o. female.  Patient was brought in because at around 8:00 she became confused and recognized things in her house and was repeating herself.  Her husband stated that she was awake at 7 and was normal at that time.   The history is provided by the patient and a relative. No language interpreter was used.  Altered Mental Status   This is a new problem. The current episode started 1 to 2 hours ago. The problem has not changed since onset.Associated symptoms include confusion. Pertinent negatives include no seizures and no hallucinations. Risk factors: History of TIAs. Her past medical history does not include seizures.    Past Medical History:  Diagnosis Date  . Arthritis   . Hypercholesteremia   . Migraine with aura   . Rheumatic fever    As a child.  . Stroke (HCC)   . TIA (transient ischemic attack)     Patient Active Problem List   Diagnosis Date Noted  . Acute encephalopathy 02/07/2017  . Bradycardia 07/02/2011  . Stroke, acute, thrombotic 07/01/2011  . Elevated blood pressure 07/01/2011  . Migraine headache 07/01/2011  . Hyperlipidemia 07/01/2011  . Left sided numbness 07/01/2011    Past Surgical History:  Procedure Laterality Date  . ABDOMINAL HYSTERECTOMY    . bladder tacking      OB History    No data available       Home Medications    Prior to Admission medications   Medication Sig Start Date End Date Taking? Authorizing Provider  atorvastatin (LIPITOR) 20 MG tablet Take 20 mg by mouth daily.   Yes [provider]  Cholecalciferol (VITAMIN D-3) 5000 UNITS TABS Take 1 tablet by mouth daily.   Yes [provider]  clopidogrel (PLAVIX) 75 MG tablet Take 75 mg by mouth daily.  06/25/12  Yes [provider]  Diclofenac Potassium (CAMBIA) 50 MG PACK Take 1 each by mouth daily as needed. Pain   Yes [provider]  fish oil-omega-3 fatty acids 1000 MG capsule Take 2 g by mouth daily.   Yes [provider]  folic acid (FOLVITE) 400 MCG tablet Take 400 mcg by mouth daily.   Yes [provider]  lansoprazole (PREVACID) 30 MG capsule TAKE ONE CAPSULE EVERY DAY 02/28/13  Yes [name], MD  losartan (COZAAR) 50 MG tablet Take 50 mg by mouth daily.   Yes [provider]  pantoprazole (PROTONIX) 40 MG tablet  11/24/13  Yes [provider]  verapamil (VERELAN PM) 240 MG 24 hr capsule Take 240 mg by mouth daily.   Yes [provider]    Family History No family history on file.  Social History Social History   Tobacco Use  . Smoking status: Never Smoker  . Smokeless tobacco: Never Used  Substance Use Topics  . Alcohol use: No    Alcohol/week: 0.0 oz  . Drug use: No     Allergies   Patient has no known allergies.   Review of Systems Review of Systems  Constitutional: Negative for appetite change and fatigue.  HENT: Negative for congestion, ear discharge and sinus pressure.   Eyes:  Negative for discharge.  Respiratory: Negative for cough.   Cardiovascular: Negative for chest pain.  Gastrointestinal: Negative for abdominal pain and diarrhea.  Genitourinary: Negative for frequency and hematuria.  Musculoskeletal: Negative for back pain.  Skin: Negative for rash.  Neurological: Negative for seizures and headaches.  Psychiatric/Behavioral: Positive for confusion. Negative for hallucinations.     Physical Exam Updated Vital Signs BP 136/76   Pulse (!) 126   Temp 97.9 F (36.6 C)   Resp 19   Ht 5\' 2"  (1.575 m)   Wt 53.5 kg (118 lb)   SpO2 100%   BMI 21.58 kg/m   Physical Exam  Constitutional: She is  oriented to person, place, and time. She appears well-developed.  HENT:  Head: Normocephalic.  Eyes: Conjunctivae and EOM are normal. No scleral icterus.  Neck: Neck supple. No thyromegaly present.  Cardiovascular: Normal rate and regular rhythm. Exam reveals no gallop and no friction rub.  No murmur heard. Pulmonary/Chest: No stridor. She has no wheezes. She has no rales. She exhibits no tenderness.  Abdominal: She exhibits no distension. There is no tenderness. There is no rebound.  Musculoskeletal: Normal range of motion. She exhibits no edema.  Lymphadenopathy:    She has no cervical adenopathy.  Neurological: She is oriented to person, place, and time. She exhibits normal muscle tone. Coordination normal.  Patient could remember how old she was  Skin: No rash noted. No erythema.  Psychiatric: She has a normal mood and affect. Her behavior is normal.     ED Treatments / Results  Labs (all labs ordered are listed, but only abnormal results are displayed) Labs Reviewed  COMPREHENSIVE METABOLIC PANEL - Abnormal; Notable for the following components:      Result Value   Creatinine, Ser 1.08 (*)    GFR calc non Af Amer 50 (*)    GFR calc Af Amer 58 (*)    All other components within normal limits  URINALYSIS, ROUTINE W REFLEX MICROSCOPIC - Abnormal; Notable for the following components:   Specific Gravity, Urine 1.004 (*)    All other components within normal limits  I-STAT CG4 LACTIC ACID, ED - Abnormal; Notable for the following components:   Lactic Acid, Venous 2.32 (*)    All other components within normal limits  URINE CULTURE  ETHANOL  PROTIME-INR  APTT  CBC  DIFFERENTIAL  RAPID URINE DRUG SCREEN, HOSP PERFORMED  I-STAT CHEM 8, ED  I-STAT TROPONIN, ED    EKG  EKG Interpretation None       Radiology Dg Chest 2 View  Result Date: 02/07/2017 CLINICAL DATA:  Confusion, weakness EXAM: CHEST  2 VIEW COMPARISON:  07/01/2011 FINDINGS: Left base atelectasis. Right  lung is clear. Heart is normal size. No effusions or acute bony abnormality. IMPRESSION: Left base atelectasis. Electronically Signed   By: Charlett NoseKevin  Dover M.D.   On: 02/07/2017 10:10   Ct Head Code Stroke Wo Contrast  Result Date: 02/07/2017 CLINICAL DATA:  Code stroke. 71 year old female was normal upon waking at 0700 hours. Confusion noticed at 0800 hours. EXAM: CT HEAD WITHOUT CONTRAST TECHNIQUE: Contiguous axial images were obtained from the base of the skull through the vertex without intravenous contrast. COMPARISON:  Brain MRI 07/01/2011.  Intracranial MRA 09/13/2011. FINDINGS: Brain: Confluent hypodensity in the right caudate and anterior lentiform (series 2, image 12) is new since the 2013 comparison. Stable overall cerebral volume since 2013. Elsewhere Gray-white matter differentiation is within normal limits throughout the brain. No midline shift, ventriculomegaly, mass  effect, evidence of mass lesion, intracranial hemorrhage or evidence of cortically based acute infarction. No cortical encephalomalacia identified. Vascular: Calcified atherosclerosis at the skull base. No suspicious intracranial vascular hyperdensity. Skull: Negative.  No acute osseous abnormality identified. Sinuses/Orbits: Clear. Other: Visualized orbits and scalp soft tissues are within normal limits. ASPECTS [facility]([place] Stroke Program Early CT Score) - Ganglionic level infarction (caudate, lentiform nuclei, internal capsule, insula, M1-M3 cortex): 7 - Supraganglionic infarction (M4-M6 cortex): 3 Total score (0-10 with 10 being normal): 10 IMPRESSION: 1. Age indeterminate but chronic appearing lacunar infarct of the right basal ganglia, new since 2013. 2. No acute intracranial hemorrhage or cortically based infarct identified. 3. ASPECTS is 10. 4. Study discussed by telephone with Dr. Jomarie LongsJOSEPH Marbella Markgraf on 02/07/2017 at 09:30 . Electronically Signed   By: Odessa FlemingH  Hall M.D.   On: 02/07/2017 09:31    Procedures Procedures (including critical  care time)  Medications Ordered in ED Medications - No data to display   Initial Impression / Assessment and Plan / ED Course  I have reviewed the triage vital signs and the nursing notes.  Pertinent labs & imaging results that were available during my care of the patient were reviewed by me and considered in my medical decision making (see chart for details).     CRITICAL CARE Performed by: Bethann BerkshireJoseph Franck Vinal Total critical care time:40 minutes Critical care time was exclusive of separately billable procedures and treating other patients. Critical care was necessary to treat or prevent imminent or life-threatening deterioration. Critical care was time spent personally by me on the following activities: development of treatment plan with patient and/or surrogate as well as nursing, discussions with consultants, evaluation of patient's response to treatment, examination of patient, obtaining history from patient or surrogate, ordering and performing treatments and interventions, ordering and review of laboratory studies, ordering and review of radiographic studies, pulse oximetry and re-evaluation of patient's condition. Patient was seen by neurology and they decided not to give her TPA.  Her stroke scale was 1.  The recommendation by neurology was to admit for stroke workup and get an MRI.  Final Clinical Impressions(s) / ED Diagnoses   Final diagnoses:  Confusion    ED Discharge Orders    None       Bethann BerkshireZammit, Jakub Debold, MD 02/07/17 1050

## 2017-02-07 NOTE — ED Notes (Signed)
edp in with pt 

## 2017-02-07 NOTE — ED Notes (Signed)
Pt taken to xray 

## 2017-02-08 ENCOUNTER — Other Ambulatory Visit (HOSPITAL_COMMUNITY): Payer: Medicare HMO

## 2017-02-08 DIAGNOSIS — G9340 Encephalopathy, unspecified: Secondary | ICD-10-CM

## 2017-02-08 DIAGNOSIS — E872 Acidosis: Secondary | ICD-10-CM

## 2017-02-08 DIAGNOSIS — E782 Mixed hyperlipidemia: Secondary | ICD-10-CM

## 2017-02-08 LAB — BASIC METABOLIC PANEL
Anion gap: 6 (ref 5–15)
BUN: 18 mg/dL (ref 6–20)
CO2: 26 mmol/L (ref 22–32)
Calcium: 8.6 mg/dL — ABNORMAL LOW (ref 8.9–10.3)
Chloride: 110 mmol/L (ref 101–111)
Creatinine, Ser: 1.05 mg/dL — ABNORMAL HIGH (ref 0.44–1.00)
GFR calc Af Amer: 60 mL/min — ABNORMAL LOW (ref >60)
GFR, EST NON AFRICAN AMERICAN: 51 mL/min — AB (ref >60)
GLUCOSE: 93 mg/dL (ref 65–99)
POTASSIUM: 3.8 mmol/L (ref 3.5–5.1)
Sodium: 142 mmol/L (ref 135–145)

## 2017-02-08 LAB — LIPID PANEL
CHOL/HDL RATIO: 3.2 ratio
Cholesterol: 114 mg/dL (ref 0–200)
HDL: 36 mg/dL — AB (ref >40)
LDL Cholesterol: 52 mg/dL (ref 0–99)
TRIGLYCERIDES: 130 mg/dL (ref <150)
VLDL: 26 mg/dL (ref 0–40)

## 2017-02-08 LAB — URINE CULTURE

## 2017-02-08 LAB — LACTIC ACID, PLASMA: LACTIC ACID, VENOUS: 1.2 mmol/L (ref 0.5–1.9)

## 2017-02-08 MED ORDER — CYANOCOBALAMIN 1000 MCG/ML IJ SOLN
1000.0000 ug | Freq: Once | INTRAMUSCULAR | Status: AC
  Administered 2017-02-08: 1000 ug via INTRAMUSCULAR
  Filled 2017-02-08: qty 1

## 2017-02-08 NOTE — Discharge Summary (Addendum)
Physician Discharge Summary  Lauren Burnancy Linnen ZOX:[id number] DOB: 08-11-1945 DOA: 02/07/2017  PCP: Elise BennePradhan, Pradeep K, MD Neurologist: Dr. Anne HahnWillis  Admit date: 02/07/2017 Discharge date: 02/08/2017  Admitted From: Home  Disposition: Home   Recommendations for Outpatient Follow-up:  1. Follow up with PCP in 1 weeks 2. Follow up with neurologist in 2 weeks for memory testing.   Home Health: NONE RECOMMENDED  Discharge Condition: STABLE  CODE STATUS: FULL    Brief Hospitalization Summary: Please see all hospital notes, images, labs for full details of the hospitalization. HPI:  Lauren Sanders is a 71 y.o. female with medical history of stroke, hypertension, hyperlipidemia presenting with new onset of confusion that began around 8 AM on February 07, 2017.  The patient has been in her usual state of health up until the morning of admission.  The patient was normal when she woke up around 7 AM getting ready for breakfast.  Her husband stated that the patient became confused and suddenly did not remember where common things were located in their house.  He states that she was speaking clearly and understandably.  However, she was a bit repetitive in her speech.  There was no hallucinations noted.  There have not been any new medications.  Because of the confusion, the patient was brought to the emergency department for further evaluation.  Code stroke was activated.  The patient was seen by tele-neurology.  She was not a candidate for TPA.  They recommended admission for stroke workup.  Patient denies fevers, chills, headache, chest pain, dyspnea, nausea, vomiting, diarrhea, abdominal pain, dysuria, hematuria, hematochezia, and melena.  The patient denies any loss of vision, blurry vision, headache, focal extremity weakness, or sensory disturbance.  The patient knows the month, year, location, and her name, but she does not know why she is in the hospital.  In the emergency department, the patient was  afebrile hemodynamically stable saturating 98-99% on room air.  BMP, LFTs, and CBC were unremarkable.  Lactic acid was at 2.32.  Alcohol level was negative.  Urinalysis and urine drug screen are pending at the time of my assessment.  CT of the brain showed age indeterminate but chronic appearing lacunar infarct in the right basal ganglia  The patient was admitted for stroke workup. She had an MRI study that was negative for acute CVA findings.  She does have chronic changes but no acute changes were seen.  She was mildly dehydrated with an elevated lactate that resolved with IVF hydration.  No s/s of infection were found.  She had an echocardiogram that did not show any significant findings. See below.  In addition, patient had carotid dopplers that did not show significant stenosis.  She was kept on plavix for antiplatelet therapy.  Her symptoms completely resolved.  She still does not have good memory of the events leading to her admission.  She has a neurologist Dr. Anne HahnWillis who I encouraged her to follow up with. She will do so.  She was seen by PT/OT and they did not recommend any further needs or treatment.  She is being discharged home to follow up with her PCP and neurologist.    Echocardiogram Study Conclusions  - Left ventricle: The cavity size was normal. Wall thickness was  normal. Systolic function was normal. The estimated ejection fraction was in the range of 60% to 65%. Wall motion was normal;   there were no regional wall motion abnormalities. Doppler parameters are consistent with abnormal left ventricular  relaxation (grade 1  diastolic dysfunction). - Aortic valve: Trileaflet; mildly calcified leaflets. - Mitral valve: There was trivial regurgitation. - Atrial septum: No defect or patent foramen ovale was identified. - Tricuspid valve: There was mild regurgitation. - Pulmonary arteries: PA peak pressure: 30 mm Hg (S). - Pericardium, extracardiac: There was no pericardial  effusion.  Impressions:  - Normal LV wall thickness with LVEF 60-65% and grade 1 diastolic dysfunction. Trivial mitral regurgitation. Mildly calcified aortic valve. Mild tricuspid regurgitation with estimated PASP . No obvious PFO or ASD.  Carotid Doppler Study IMPRESSION: 1. No significant carotid atherosclerotic vascular disease. Minimal atherosclerotic vascular plaque noted at the left carotid bifurcation. No flow limiting stenosis. Degree of stenosis less than 50% bilaterally.  2. Vertebrals are patent with antegrade flow.    Discharge Diagnoses:  Active Problems:   Hyperlipidemia   Acute encephalopathy   Lactic acidosis    Discharge Instructions: Discharge Instructions    Call MD for:  difficulty breathing, headache or visual disturbances   Complete by:  As directed    Call MD for:  extreme fatigue   Complete by:  As directed    Call MD for:  persistant dizziness or light-headedness   Complete by:  As directed    Call MD for:  persistant nausea and vomiting   Complete by:  As directed    Call MD for:  severe uncontrolled pain   Complete by:  As directed    Diet - low sodium heart healthy   Complete by:  As directed    Increase activity slowly   Complete by:  As directed      Allergies as of 02/08/2017   No Known Allergies     Medication List    STOP taking these medications   pantoprazole 40 MG tablet Commonly known as:  PROTONIX     TAKE these medications   atorvastatin 20 MG tablet Commonly known as:  LIPITOR Take 20 mg by mouth daily.   CAMBIA 50 MG Pack Generic drug:  Diclofenac Potassium Take 1 each by mouth daily as needed. Pain   clopidogrel 75 MG tablet Commonly known as:  PLAVIX Take 75 mg by mouth daily.   fish oil-omega-3 fatty acids 1000 MG capsule Take 2 g by mouth daily.   folic acid 400 MCG tablet Commonly known as:  FOLVITE Take 400 mcg by mouth daily.   lansoprazole 30 MG capsule Commonly known as:   PREVACID TAKE ONE CAPSULE EVERY DAY   losartan 50 MG tablet Commonly known as:  COZAAR Take 50 mg by mouth daily.   verapamil 240 MG 24 hr capsule Commonly known as:  VERELAN PM Take 240 mg by mouth daily.   Vitamin D-3 5000 units Tabs Take 1 tablet by mouth daily.      Follow-up Information    Pradhan, Sid Falcon, MD. Schedule an appointment as soon as possible for a visit in 1 week(s).   Specialty:  Internal Medicine Contact information: [address] [place] [address] [phone]        [name], MD. Schedule an appointment as soon as possible for a visit in 2 week(s).   Specialty:  Neurology Why:  Hospital Follow up Memory testing Contact information: [address] [place] [address] [phone]          No Known Allergies Current Discharge Medication List    CONTINUE these medications which have NOT CHANGED   Details  atorvastatin (LIPITOR) 20 MG tablet Take 20 mg by  mouth daily.    Cholecalciferol (VITAMIN D-3) 5000 UNITS TABS Take 1 tablet by mouth daily.    clopidogrel (PLAVIX) 75 MG tablet Take 75 mg by mouth daily.     Diclofenac Potassium (CAMBIA) 50 MG PACK Take 1 each by mouth daily as needed. Pain    fish oil-omega-3 fatty acids 1000 MG capsule Take 2 g by mouth daily.    folic acid (FOLVITE) 400 MCG tablet Take 400 mcg by mouth daily.    lansoprazole (PREVACID) 30 MG capsule TAKE ONE CAPSULE EVERY DAY Qty: 30 capsule, Refills: 6    losartan (COZAAR) 50 MG tablet Take 50 mg by mouth daily.    verapamil (VERELAN PM) 240 MG 24 hr capsule Take 240 mg by mouth daily.      STOP taking these medications     pantoprazole (PROTONIX) 40 MG tablet         Procedures/Studies: Dg Chest 2 View  Result Date: 02/07/2017 CLINICAL DATA:  Confusion, weakness EXAM: CHEST  2 VIEW COMPARISON:  07/01/2011 FINDINGS: Left base atelectasis. Right lung is clear. Heart is normal size. No effusions or acute bony abnormality.  IMPRESSION: Left base atelectasis. Electronically Signed   By: Charlett Nose M.D.   On: 02/07/2017 10:10   Mr Brain Wo Contrast  Result Date: 02/07/2017 CLINICAL DATA:  71 y/o F; one day of confusion and weakness. Memory loss. EXAM: MRI HEAD WITHOUT CONTRAST MRA HEAD WITHOUT CONTRAST TECHNIQUE: Multiplanar, multiecho pulse sequences of the brain and surrounding structures were obtained without intravenous contrast. Angiographic images of the head were obtained using MRA technique without contrast. COMPARISON:  02/07/2017 CT head. 06/30/2016 MRI head. 09/12/2016 MRA head. FINDINGS: MRI HEAD FINDINGS Brain: No acute infarction, hemorrhage, hydrocephalus, extra-axial collection or mass lesion. Small chronic lacunar infarction in right anterior putamen extending into frontal periventricular white matter. Tiny chronic lacunar infarct in left thalamus. Nonspecific foci of T2 FLAIR hyperintense signal abnormality in subcortical and periventricular white matter are compatible mild chronic microvascular ischemic changes and there is mild brain parenchymal volume loss. Vascular: As below. Skull and upper cervical spine: Normal marrow signal. Sinuses/Orbits: Mild ethmoid and right maxillary sinus mucosal thickening. No abnormal signal of mastoid air cells. Orbits are unremarkable. Other: None. MRA HEAD FINDINGS Internal carotid arteries:  Patent. Anterior cerebral arteries:  Patent. Middle cerebral arteries: Patent. Anterior communicating artery: Patent. Posterior communicating arteries: Not identified, likely hypoplastic or absent. Posterior cerebral arteries: Patent. Stable left P2 short segment of severe stenosis. Basilar artery:  Patent. Vertebral arteries:  Patent. No additional evidence of high-grade stenosis, large vessel occlusion, or aneurysm. IMPRESSION: MRI head: 1. No acute intracranial abnormality identified. 2. Chronic right anterior basal ganglia lacunar infarct. 3. Mild chronic microvascular ischemic  changes and parenchymal volume loss of the brain. MRI head: 1. 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 2. No new large vessel occlusion, stenosis, or aneurysm identified. Electronically Signed   By: Mitzi Hansen M.D.   On: 02/07/2017 14:18   US Carotid Bilateral (at [facility] Only)  Result Date: 02/07/2017 CLINICAL DATA:  CVA. EXAM: BILATERAL CAROTID DUPLEX ULTRASOUND TECHNIQUE: Wallace Cullens scale imaging, color Doppler and duplex ultrasound were performed of bilateral carotid and vertebral arteries in the neck. COMPARISON:  MRA 02/07/2017.  Ultrasound 06/21/2011. FINDINGS: Criteria: Quantification of carotid stenosis is based on velocity parameters that correlate the residual internal carotid diameter with NASCET-based stenosis levels, using the diameter of the distal internal carotid lumen as the denominator for stenosis measurement. The following velocity  measurements were obtained: RIGHT ICA:  156/32 cm/sec CCA:  93/21 cm/sec SYSTOLIC ICA/CCA RATIO:  1.7 DIASTOLIC ICA/CCA RATIO:  1.5 ECA:  130 cm/sec LEFT ICA:  112/39 cm/sec CCA:  82/22 cm/sec SYSTOLIC ICA/CCA RATIO:  1.4 DIASTOLIC ICA/CCA RATIO:  1.8 ECA:  112 cm/sec RIGHT CAROTID ARTERY: No significant right carotid atherosclerotic vascular disease. No flow limiting stenosis. RIGHT VERTEBRAL ARTERY:  Patent with antegrade flow. LEFT CAROTID ARTERY: No significant left carotid atherosclerotic vascular disease. Minimal plaque at the bifurcation noted. No flow limiting stenosis. LEFT VERTEBRAL ARTERY:  Patent antegrade flow. IMPRESSION: 1. No significant carotid atherosclerotic vascular disease. Minimal atherosclerotic vascular plaque noted at the left carotid bifurcation. No flow limiting stenosis. Degree of stenosis less than 50% bilaterally. 2. Vertebrals are patent with antegrade flow . Electronically Signed   By: Maisie Fus  Register   On: 02/07/2017 14:45   Mr Maxine Glenn Head/brain Wo Cm  Result Date: 02/07/2017 CLINICAL DATA:  71 y/o F; one day  of confusion and weakness. Memory loss. EXAM: MRI HEAD WITHOUT CONTRAST MRA HEAD WITHOUT CONTRAST TECHNIQUE: Multiplanar, multiecho pulse sequences of the brain and surrounding structures were obtained without intravenous contrast. Angiographic images of the head were obtained using MRA technique without contrast. COMPARISON:  02/07/2017 CT head. 06/30/2016 MRI head. 09/12/2016 MRA head. FINDINGS: MRI HEAD FINDINGS Brain: No acute infarction, hemorrhage, hydrocephalus, extra-axial collection or mass lesion. Small chronic lacunar infarction in right anterior putamen extending into frontal periventricular white matter. Tiny chronic lacunar infarct in left thalamus. Nonspecific foci of T2 FLAIR hyperintense signal abnormality in subcortical and periventricular white matter are compatible mild chronic microvascular ischemic changes and there is mild brain parenchymal volume loss. Vascular: As below. Skull and upper cervical spine: Normal marrow signal. Sinuses/Orbits: Mild ethmoid and right maxillary sinus mucosal thickening. No abnormal signal of mastoid air cells. Orbits are unremarkable. Other: None. MRA HEAD FINDINGS Internal carotid arteries:  Patent. Anterior cerebral arteries:  Patent. Middle cerebral arteries: Patent. Anterior communicating artery: Patent. Posterior communicating arteries: Not identified, likely hypoplastic or absent. Posterior cerebral arteries: Patent. Stable left P2 short segment of severe stenosis. Basilar artery:  Patent. Vertebral arteries:  Patent. No additional evidence of high-grade stenosis, large vessel occlusion, or aneurysm. IMPRESSION: MRI head: 1. No acute intracranial abnormality identified. 2. Chronic right anterior basal ganglia lacunar infarct. 3. Mild chronic microvascular ischemic changes and parenchymal volume loss of the brain. MRI head: 1. 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 2. No new large vessel occlusion, stenosis, or aneurysm identified. Electronically  Signed   By: Mitzi Hansen M.D.   On: 02/07/2017 14:18   Ct Head Code Stroke Wo Contrast  Result Date: 02/07/2017 CLINICAL DATA:  Code stroke. 71 year old female was normal upon waking at 0700 hours. Confusion noticed at 0800 hours. EXAM: CT HEAD WITHOUT CONTRAST TECHNIQUE: Contiguous axial images were obtained from the base of the skull through the vertex without intravenous contrast. COMPARISON:  Brain MRI 07/01/2011.  Intracranial MRA 09/13/2011. FINDINGS: Brain: Confluent hypodensity in the right caudate and anterior lentiform (series 2, image 12) is new since the 2013 comparison. Stable overall cerebral volume since 2013. Elsewhere Gray-white matter differentiation is within normal limits throughout the brain. No midline shift, ventriculomegaly, mass effect, evidence of mass lesion, intracranial hemorrhage or evidence of cortically based acute infarction. No cortical encephalomalacia identified. Vascular: Calcified atherosclerosis at the skull base. No suspicious intracranial vascular hyperdensity. Skull: Negative.  No acute osseous abnormality identified. Sinuses/Orbits: Clear. Other: Visualized orbits and scalp soft tissues are within  normal limits. ASPECTS [facility]([place] Stroke Program Early CT Score) - Ganglionic level infarction (caudate, lentiform nuclei, internal capsule, insula, M1-M3 cortex): 7 - Supraganglionic infarction (M4-M6 cortex): 3 Total score (0-10 with 10 being normal): 10 IMPRESSION: 1. Age indeterminate but chronic appearing lacunar infarct of the right basal ganglia, new since 2013. 2. No acute intracranial hemorrhage or cortically based infarct identified. 3. ASPECTS is 10. 4. Study discussed by telephone with Dr. Jomarie LongsJOSEPH ZAMMIT on 02/07/2017 at 09:30 . Electronically Signed   By: Odessa FlemingH  Hall M.D.   On: 02/07/2017 09:31      Subjective: Pt reports that she feels fine, her speech is normal and she has no other symptoms. She still does not have good recollection of yesterday and  the events leading to her admission.    Discharge Exam: Vitals:   02/08/17 0340 02/08/17 0740  BP: (!) 143/64 132/66  Pulse: 66 66  Resp: 18 18  Temp: (!) 97.3 F (36.3 C) (!) 97.5 F (36.4 C)  SpO2: 95% 96%   Vitals:   02/07/17 2140 02/07/17 2340 02/08/17 0340 02/08/17 0740  BP: (!) 124/46 (!) 113/54 (!) 143/64 132/66  Pulse: 61 70 66 66  Resp: 19 18 18 18   Temp: 98.4 F (36.9 C) 97.9 F (36.6 C) (!) 97.3 F (36.3 C) (!) 97.5 F (36.4 C)  TempSrc: Oral Oral Oral Oral  SpO2: 99% 96% 95% 96%  Weight:      Height:       General: Pt is alert, awake, not in acute distress Cardiovascular: RRR, S1/S2 +, no rubs, no gallops Respiratory: CTA bilaterally, no wheezing, no rhonchi Abdominal: Soft, NT, ND, bowel sounds + Extremities: no edema, no cyanosis   The results of significant diagnostics from this hospitalization (including imaging, microbiology, ancillary and laboratory) are listed below for reference.     Microbiology: Recent Results (from the past 240 hour(s))  Culture, blood (Routine X 2) w Reflex to ID Panel     Status: None (Preliminary result)   Collection Time: 02/07/17 12:48 PM  Result Value Ref Range Status   Specimen Description RIGHT ANTECUBITAL  Final   Special Requests   Final    BOTTLES DRAWN AEROBIC AND ANAEROBIC Blood Culture adequate volume   Culture NO GROWTH < 24 HOURS  Final   Report Status PENDING  Incomplete  Culture, blood (Routine X 2) w Reflex to ID Panel     Status: None (Preliminary result)   Collection Time: 02/07/17 12:54 PM  Result Value Ref Range Status   Specimen Description LEFT ANTECUBITAL  Final   Special Requests   Final    BOTTLES DRAWN AEROBIC AND ANAEROBIC Blood Culture adequate volume   Culture NO GROWTH < 24 HOURS  Final   Report Status PENDING  Incomplete     Labs: BNP (last 3 results) No results for input(s): BNP in the last 8760 hours. Basic Metabolic Panel: Recent Labs  Lab 02/07/17 0908 02/07/17 0915  02/08/17 0357  NA 139 144 142  K 3.6 3.8 3.8  CL 105 107 110  CO2 25  --  26  GLUCOSE 95 94 93  BUN 18 17 18   CREATININE 1.08* 1.00 1.05*  CALCIUM 9.6  --  8.6*   Liver Function Tests: Recent Labs  Lab 02/07/17 0908  AST 22  ALT 16  ALKPHOS 58  BILITOT 1.0  PROT 7.5  ALBUMIN 4.6   No results for input(s): LIPASE, AMYLASE in the last 168 hours. Recent Labs  Lab 02/07/17  1248  AMMONIA 9   CBC: Recent Labs  Lab 02/07/17 0908 02/07/17 0915  WBC 6.8  --   NEUTROABS 4.4  --   HGB 14.1 13.6  HCT 43.5 40.0  MCV 92.2  --   PLT 281  --    Cardiac Enzymes: No results for input(s): CKTOTAL, CKMB, CKMBINDEX, TROPONINI in the last 168 hours. BNP: Invalid input(s): POCBNP CBG: No results for input(s): GLUCAP in the last 168 hours. D-Dimer No results for input(s): DDIMER in the last 72 hours. Hgb A1c No results for input(s): HGBA1C in the last 72 hours. Lipid Profile Recent Labs    02/08/17 0357  CHOL 114  HDL 36*  LDLCALC 52  TRIG 161  CHOLHDL 3.2   Thyroid function studies Recent Labs    02/07/17 1248  TSH 3.530   Anemia work up Recent Labs    02/07/17 1248  VITAMINB12 213   Urinalysis    Component Value Date/Time   COLORURINE YELLOW 02/07/2017 1137   APPEARANCEUR CLEAR 02/07/2017 1137   LABSPEC 1.014 02/07/2017 1137   PHURINE 6.0 02/07/2017 1137   GLUCOSEU NEGATIVE 02/07/2017 1137   HGBUR NEGATIVE 02/07/2017 1137   BILIRUBINUR NEGATIVE 02/07/2017 1137   KETONESUR NEGATIVE 02/07/2017 1137   PROTEINUR NEGATIVE 02/07/2017 1137   UROBILINOGEN 0.2 07/23/2011 1320   NITRITE NEGATIVE 02/07/2017 1137   LEUKOCYTESUR NEGATIVE 02/07/2017 1137   Sepsis Labs Invalid input(s): PROCALCITONIN,  WBC,  LACTICIDVEN Microbiology Recent Results (from the past 240 hour(s))  Culture, blood (Routine X 2) w Reflex to ID Panel     Status: None (Preliminary result)   Collection Time: 02/07/17 12:48 PM  Result Value Ref Range Status   Specimen Description RIGHT  ANTECUBITAL  Final   Special Requests   Final    BOTTLES DRAWN AEROBIC AND ANAEROBIC Blood Culture adequate volume   Culture NO GROWTH < 24 HOURS  Final   Report Status PENDING  Incomplete  Culture, blood (Routine X 2) w Reflex to ID Panel     Status: None (Preliminary result)   Collection Time: 02/07/17 12:54 PM  Result Value Ref Range Status   Specimen Description LEFT ANTECUBITAL  Final   Special Requests   Final    BOTTLES DRAWN AEROBIC AND ANAEROBIC Blood Culture adequate volume   Culture NO GROWTH < 24 HOURS  Final   Report Status PENDING  Incomplete   Time coordinating discharge:   SIGNED:  Standley Dakins, MD  [facility] 02/08/2017, 9:18 AM Pager [phone]  If 7PM-7AM, please contact night-coverage [email or web address] Password [email or web address]

## 2017-02-08 NOTE — Progress Notes (Signed)
Skilled PT screen performed on this patient this morning. Patient is independent at baseline, and reports she has no difficulty with mobility or falls at home, she does not use an assistive device whatsoever at baseline as well. Examination reveals that functional strength, balance, gait, and safety awareness are all WNL, and that patient is at very low risk for fall.   Patient does not appear to be in need of skilled PT services in this facility or after discharge by MD as she is generally at her baseline.   Patient should be perfectly safe to independently ambulate in the hospital hallway given her high level of function from PT perspective.  Thank you for the opportunity to participate in the care of this patient, PT signing off.   Nedra HaiKristen Unger PT, DPT, CBIS  Supplemental Physical Therapist [facility]

## 2017-02-08 NOTE — Care Management Note (Signed)
Case Management Note  Patient Details  Name: Lauren Sanders MRN: [id number] Date of Birth: 1945-07-24  Subjective/Objective:     Admitted with encephalopathy. Chart reviewed for CM needs. Pt alert and ordiented this AM. She is from home, lives with spouse. She has PCP, transportation and insurance with drug coverage. PT/OT have signed off with no recommendations.             Action/Plan: DC home with self care. No CM needs noted at DC.   Expected Discharge Date:  02/08/17               Expected Discharge Plan:  Home/Self Care  In-House Referral:  NA  Discharge planning Services  NA  Post Acute Care Choice:  NA Choice offered to:  NA  Status of Service:  Completed, signed off  Malcolm MetroChildress, Ameyah Bangura Demske, RN 02/08/2017, 9:23 AM

## 2017-02-08 NOTE — Discharge Instructions (Signed)
You Have an outpatient EEG scheduled appointment for December 4th,2018. [facility] at 9:15am. Your appt time is 09:30am.  Follow with Primary MD  Elise BennePradhan, Pradeep K, MD  and other consultant's as instructed your Hospitalist MD  Please get a complete blood count and chemistry panel checked by your Primary MD at your next visit, and again as instructed by your Primary MD.  Get Medicines reviewed and adjusted: Please take all your medications with you for your next visit with your Primary MD  Laboratory/radiological data: Please request your Primary MD to go over all hospital tests and procedure/radiological results at the follow up, please ask your Primary MD to get all Hospital records sent to his/her office.  In some cases, they will be blood work, cultures and biopsy results pending at the time of your discharge. Please request that your primary care M.D. follows up on these results.  Also Note the following: If you experience worsening of your admission symptoms, develop shortness of breath, life threatening emergency, suicidal or homicidal thoughts you must seek medical attention immediately by calling 911 or calling your MD immediately  if symptoms less severe.  You must read complete instructions/literature along with all the possible adverse reactions/side effects for all the Medicines you take and that have been prescribed to you. Take any new Medicines after you have completely understood and accpet all the possible adverse reactions/side effects.   Do not drive when taking Pain medications or sleeping medications (Benzodaizepines)  Do not take more than prescribed Pain, Sleep and Anxiety Medications. It is not advisable to combine anxiety,sleep and pain medications without talking with your primary care practitioner  Special Instructions: If you have smoked or chewed Tobacco  in the last 2 yrs please stop smoking, stop any regular Alcohol  and or any  Recreational drug use.  Wear Seat belts while driving.  Please note: You were cared for by a hospitalist during your hospital stay. Once you are discharged, your primary care physician will handle any further medical issues. Please note that NO REFILLS for any discharge medications will be authorized once you are discharged, as it is imperative that you return to your primary care physician (or establish a relationship with a primary care physician if you do not have one) for your post hospital discharge needs so that they can reassess your need for medications and monitor your lab values.

## 2017-02-08 NOTE — Care Management CC44 (Signed)
Condition Code 44 Documentation Completed  Patient Details  Name: Lauren Sanders MRN: [id number] Date of Birth: 1945-06-15   Condition Code 44 given:  Yes Patient signature on Condition Code 44 notice:  Yes Documentation of 2 MD's agreement:  Yes Code 44 added to claim:  Yes    Lauren Sanders, Lauren Cancelliere Demske, RN 02/08/2017, 10:07 AM

## 2017-02-08 NOTE — Evaluation (Addendum)
Occupational Therapy Evaluation Patient Details Name: Lauren Sanders MRN: [id number] DOB: 11/29/1945 Today's Date: 02/08/2017    History of Present Illness Lauren Burnancy Doro is a 71 y.o. female with medical history of stroke, hypertension, hyperlipidemia presenting with new onset of confusion that began around 8 AM on February 07, 2017.  The patient has been in her usual state of health up until the morning of admission.  The patient was normal when she woke up around 7 AM getting ready for breakfast.  Her husband stated that the patient became confused and suddenly did not remember where common things were located in their house.  He states that she was speaking clearly and understandably.  However, she was a bit repetitive in her speech.  There was no hallucinations noted.  There have not been any new medications.  Because of the confusion, the patient was brought to the emergency department for further evaluation.  Code stroke was activated.  The patient was seen by tele-neurology.  She was not a candidate for TPA.  They recommended admission for stroke workup.  Patient denies fevers, chills, headache, chest pain, dyspnea, nausea, vomiting, diarrhea, abdominal pain, dysuria, hematuria, hematochezia, and melena.  The patient denies any loss of vision, blurry vision, headache, focal extremity weakness, or sensory disturbance.  The patient knows the month, year, location, and her name, but she does not know why she is in the hospital.    Clinical Impression   Pt received in bed, husband present, agreeable to OT evaluation. Pt is alert and oriented x4 this am, demonstrates BUE strength WNL, coordination and sensation are intact. Pt demonstrates independence in B/ADLs and functional mobility, including managing IV pole. No further OT services required as pt is at baseline with functional task completion.     Follow Up Recommendations  No OT follow up    Equipment Recommendations  None recommended by OT        Precautions / Restrictions Precautions Precautions: None Restrictions Weight Bearing Restrictions: No      Mobility Bed Mobility Overal bed mobility: Independent                Transfers Overall transfer level: Independent Equipment used: None                      ADL either performed or assessed with clinical judgement   ADL Overall ADL's : Independent;At baseline                                             Vision Baseline Vision/History: Wears glasses Wears Glasses: At all times Vision Assessment?: No apparent visual deficits            Pertinent Vitals/Pain Pain Assessment: No/denies pain     Hand Dominance Right   Extremity/Trunk Assessment Upper Extremity Assessment Upper Extremity Assessment: Overall WFL for tasks assessed   Lower Extremity Assessment Lower Extremity Assessment: Defer to PT evaluation   Cervical / Trunk Assessment Cervical / Trunk Assessment: Normal   Communication Communication Communication: No difficulties   Cognition Arousal/Alertness: Awake/alert Behavior During Therapy: WFL for tasks assessed/performed Overall Cognitive Status: Within Functional Limits for tasks assessed  Home Living Family/patient expects to be discharged to:: Private residence Living Arrangements: Spouse/significant other Available Help at Discharge: Family;Available 24 hours/day Type of Home: House Home Access: Level entry     Home Layout: Two level;Laundry or work area in basement;Full bath on main level;Able to live on main level with bedroom/bathroom Alternate Level Stairs-Number of Steps: 8 to get upstairs, 8 to the basement Alternate Level Stairs-Rails: Can reach both Bathroom Shower/Tub: Chief Strategy OfficerTub/shower unit   Bathroom Toilet: Standard     Home Equipment: Grab bars - tub/shower          Prior Functioning/Environment Level of Independence:  Independent        Comments: independent with B/IADLs including driving     40/98/1111/28/18 [id number]  AM-PAC OT "6 Clicks" Daily Activity Outcome Measure  Help from another person eating meals? 4  Help from another person taking care of personal grooming? 4  Help from another person toileting, which includes using toliet, bedpan, or urinal? 4  Help from another person bathing (including washing, rinsing, drying)? 3  Help from another person to put on and taking off regular upper body clothing? 4  Help from another person to put on and taking off regular lower body clothing? 4  6 Click Score 23  ADL G Code Conversion CI     02/08/17 0700  OT G-codes **NOT FOR INPATIENT CLASS**  Functional Assessment Tool Used AM-PAC 6 Clicks Daily Activity  Functional Limitation Self care  Self Care Current Status (W2956(G8987) CI  Self Care Goal Status (O1308(G8988) CI  Self Care Discharge Status (M5784(G8989) CI        End of Session    Activity Tolerance: Patient tolerated treatment well Patient left: in bed;with call bell/phone within reach;with family/visitor present  OT Visit Diagnosis: Muscle weakness (generalized) (M62.81)                Time: [id number] OT Time Calculation (min): 11 min Charges:  OT General Charges $OT Visit: 1 Visit OT Evaluation $OT Eval Low Complexity: 1 Low  Ezra SitesLeslie Uziah Sorter, OTR/L  [phone] 02/08/2017, 7:52 AM

## 2017-02-08 NOTE — Consult Note (Signed)
[place] A. Merlene Laughter, MD     [email or web address]          Lauren Sanders is an 71 y.o. female.   ASSESSMENT/PLAN: Acute confusional state unclear etiology:  Imaging failed to reveal the acute ischemic changes.  The semiology could be due to complex partial seizures.  Medication effect seems unlikely.  Transient global amnesia also seems unlikely given that she has had previous episodes.  An EEG is recommended.  She has a previous relationship with Dr. Jannifer Franklin and g per and this could be done there or as outpatient at this hospital as the patient is being discharged early.  Vitamin B12 deficiency:  This has been replaced and should be replaced long-term.  The patient presents with the acute onset of acute disorientation, confusion and the memory impairment.  The episode lasted several hours.  She denies focal neurological symptoms.  She denies focal numbness, weakness, dizziness, chest pain or shortness of breath.  She has had previous episodes in the past similar to this although those were associated with facial droop on one side.  She has seen Dr. Jannifer Franklin increase per and has been evaluated for this.  She tells me she was diagnosed as having a "mini-stroke".  The review of systems otherwise negative.  She appears to be back at baseline.     GENERAL:  This is a very pleasant female who is in no acute distress.  HEENT:  This is normal.  ABDOMEN: Soft  EXTREMITIES: No edema   BACK: Normal alignment.  SKIN: Normal by inspection.    MENTAL STATUS: Alert and oriented. Speech, language and cognition are generally intact. Judgment and insight normal.   CRANIAL NERVES: Pupils are equal, round and reactive to light and accommodation; extraocular movements are full, there is no significant nystagmus; upper and lower facial muscles are normal in strength and symmetric, there is no flattening of the nasolabial folds; tongue is midline; uvula is midline; shoulder elevation is  normal.  MOTOR: Normal tone, bulk and strength; no pronator drift.  COORDINATION: Left finger to nose is normal, right finger to nose is normal, No rest tremor; no intention tremor; no postural tremor; no bradykinesia.  REFLEXES: Deep tendon reflexes are symmetrical and normal. Plantar responses are flexor bilaterally.   SENSATION: Normal to light touch and temperature.    Blood pressure 132/66, pulse 66, temperature (!) 97.5 F (36.4 C), temperature source Oral, resp. rate 18, height '5\' 2"'$  (1.575 m), weight 118 lb (53.5 kg), SpO2 96 %.  Past Medical History:  Diagnosis Date  . Arthritis   . Hypercholesteremia   . Migraine with aura   . Rheumatic fever    As a child.  . Stroke ([place])   . TIA (transient ischemic attack)     Past Surgical History:  Procedure Laterality Date  . ABDOMINAL HYSTERECTOMY    . bladder tacking      History reviewed. No pertinent family history.  Social History:  reports that  has never smoked. she has never used smokeless tobacco. She reports that she does not drink alcohol or use drugs.  Allergies: No Known Allergies  Medications: Prior to Admission medications   Medication Sig Start Date End Date Taking? Authorizing Provider  atorvastatin (LIPITOR) 20 MG tablet Take 20 mg by mouth daily.   Yes [provider]  Cholecalciferol (VITAMIN D-3) 5000 UNITS TABS Take 1 tablet by mouth daily.   Yes [provider]  clopidogrel (PLAVIX) 75 MG tablet Take 75 mg by  mouth daily.  06/25/12  Yes [provider]  Diclofenac Potassium (CAMBIA) 50 MG PACK Take 1 each by mouth daily as needed. Pain   Yes [provider]  fish oil-omega-3 fatty acids 1000 MG capsule Take 2 g by mouth daily.   Yes [provider]  folic acid (FOLVITE) 071 MCG tablet Take 400 mcg by mouth daily.   Yes [provider]  lansoprazole (PREVACID) 30 MG capsule TAKE ONE CAPSULE EVERY DAY 02/28/13  Yes Kathrynn Ducking, MD  losartan  (COZAAR) 50 MG tablet Take 50 mg by mouth daily.   Yes [provider]  pantoprazole (PROTONIX) 40 MG tablet  11/24/13  Yes [provider]  verapamil (VERELAN PM) 240 MG 24 hr capsule Take 240 mg by mouth daily.   Yes [provider]    Scheduled Meds: . atorvastatin  20 mg Oral Daily  . cholecalciferol   Oral Daily  . clopidogrel  75 mg Oral Daily  . cyanocobalamin  1,000 mcg Intramuscular Once  . enoxaparin (LOVENOX) injection  40 mg Subcutaneous Q24H  . folic acid  219 mcg Oral Daily  . omega-3 acid ethyl esters  2 g Oral Daily  . pantoprazole  40 mg Oral Daily   Continuous Infusions: . 0.9 % NaCl with KCl 20 mEq / L 75 mL/hr at 02/08/17 0235   PRN Meds:.acetaminophen **OR** acetaminophen (TYLENOL) oral liquid 160 mg/5 mL **OR** acetaminophen, senna-docusate     Results for orders placed or performed during the hospital encounter of 02/07/17 (from the past 48 hour(s))  Ethanol     Status: None   Collection Time: 02/07/17  9:08 AM  Result Value Ref Range   Alcohol, Ethyl (B) <10 <10 mg/dL    Comment:        LOWEST DETECTABLE LIMIT FOR SERUM ALCOHOL IS 10 mg/dL FOR MEDICAL PURPOSES ONLY   Protime-INR     Status: None   Collection Time: 02/07/17  9:08 AM  Result Value Ref Range   Prothrombin Time 12.2 11.4 - 15.2 seconds   INR 0.91   APTT     Status: None   Collection Time: 02/07/17  9:08 AM  Result Value Ref Range   aPTT 25 24 - 36 seconds  CBC     Status: None   Collection Time: 02/07/17  9:08 AM  Result Value Ref Range   WBC 6.8 4.0 - 10.5 K/uL   RBC 4.72 3.87 - 5.11 MIL/uL   Hemoglobin 14.1 12.0 - 15.0 g/dL   HCT 43.5 36.0 - 46.0 %   MCV 92.2 78.0 - 100.0 fL   MCH 29.9 26.0 - 34.0 pg   MCHC 32.4 30.0 - 36.0 g/dL   RDW 12.9 11.5 - 15.5 %   Platelets 281 150 - 400 K/uL  Differential     Status: None   Collection Time: 02/07/17  9:08 AM  Result Value Ref Range   Neutrophils Relative % 64 %   Neutro Abs 4.4 1.7 - 7.7 K/uL    Lymphocytes Relative 27 %   Lymphs Abs 1.8 0.7 - 4.0 K/uL   Monocytes Relative 6 %   Monocytes Absolute 0.4 0.1 - 1.0 K/uL   Eosinophils Relative 2 %   Eosinophils Absolute 0.1 0.0 - 0.7 K/uL   Basophils Relative 1 %   Basophils Absolute 0.0 0.0 - 0.1 K/uL  Comprehensive metabolic panel     Status: Abnormal   Collection Time: 02/07/17  9:08 AM  Result Value Ref  Range   Sodium 139 135 - 145 mmol/L   Potassium 3.6 3.5 - 5.1 mmol/L   Chloride 105 101 - 111 mmol/L   CO2 25 22 - 32 mmol/L   Glucose, Bld 95 65 - 99 mg/dL   BUN 18 6 - 20 mg/dL   Creatinine, Ser 1.08 (H) 0.44 - 1.00 mg/dL   Calcium 9.6 8.9 - 10.3 mg/dL   Total Protein 7.5 6.5 - 8.1 g/dL   Albumin 4.6 3.5 - 5.0 g/dL   AST 22 15 - 41 U/L   ALT 16 14 - 54 U/L   Alkaline Phosphatase 58 38 - 126 U/L   Total Bilirubin 1.0 0.3 - 1.2 mg/dL   GFR calc non Af Amer 50 (L) >60 mL/min   GFR calc Af Amer 58 (L) >60 mL/min    Comment: (NOTE) The eGFR has been calculated using the CKD EPI equation. This calculation has not been validated in all clinical situations. eGFR's persistently <60 mL/min signify possible Chronic Kidney Disease.    Anion gap 9 5 - 15  I-stat troponin, ED     Status: None   Collection Time: 02/07/17  9:08 AM  Result Value Ref Range   Troponin i, poc 0.00 0.00 - 0.08 ng/mL   Comment 3            Comment: Due to the release kinetics of cTnI, a negative result within the first hours of the onset of symptoms does not rule out myocardial infarction with certainty. If myocardial infarction is still suspected, repeat the test at appropriate intervals.   Urine rapid drug screen (hosp performed)     Status: None   Collection Time: 02/07/17  9:08 AM  Result Value Ref Range   Opiates NONE DETECTED NONE DETECTED   Cocaine NONE DETECTED NONE DETECTED   Benzodiazepines NONE DETECTED NONE DETECTED   Amphetamines NONE DETECTED NONE DETECTED   Tetrahydrocannabinol NONE DETECTED NONE DETECTED   Barbiturates NONE  DETECTED NONE DETECTED    Comment:        DRUG SCREEN FOR MEDICAL PURPOSES ONLY.  IF CONFIRMATION IS NEEDED FOR ANY PURPOSE, NOTIFY LAB WITHIN 5 DAYS.        LOWEST DETECTABLE LIMITS FOR URINE DRUG SCREEN Drug Class       Cutoff (ng/mL) Amphetamine      1000 Barbiturate      200 Benzodiazepine   269 Tricyclics       485 Opiates          300 Cocaine          300 THC              50   Urinalysis, Routine w reflex microscopic     Status: Abnormal   Collection Time: 02/07/17  9:08 AM  Result Value Ref Range   Color, Urine YELLOW YELLOW   APPearance CLEAR CLEAR   Specific Gravity, Urine 1.004 (L) 1.005 - 1.030   pH 6.0 5.0 - 8.0   Glucose, UA NEGATIVE NEGATIVE mg/dL   Hgb urine dipstick NEGATIVE NEGATIVE   Bilirubin Urine NEGATIVE NEGATIVE   Ketones, ur NEGATIVE NEGATIVE mg/dL   Protein, ur NEGATIVE NEGATIVE mg/dL   Nitrite NEGATIVE NEGATIVE   Leukocytes, UA NEGATIVE NEGATIVE  I-Stat CG4 Lactic Acid, ED     Status: Abnormal   Collection Time: 02/07/17  9:11 AM  Result Value Ref Range   Lactic Acid, Venous 2.32 (HH) 0.5 - 1.9 mmol/L   Comment NOTIFIED PHYSICIAN  I-Stat Chem 8, ED     Status: None   Collection Time: 02/07/17  9:15 AM  Result Value Ref Range   Sodium 144 135 - 145 mmol/L   Potassium 3.8 3.5 - 5.1 mmol/L   Chloride 107 101 - 111 mmol/L   BUN 17 6 - 20 mg/dL   Creatinine, Ser 1.00 0.44 - 1.00 mg/dL   Glucose, Bld 94 65 - 99 mg/dL   Calcium, Ion 1.17 1.15 - 1.40 mmol/L   TCO2 24 22 - 32 mmol/L   Hemoglobin 13.6 12.0 - 15.0 g/dL   HCT 40.0 36.0 - 46.0 %  Urinalysis, Complete w Microscopic     Status: Abnormal   Collection Time: 02/07/17 11:37 AM  Result Value Ref Range   Color, Urine YELLOW YELLOW   APPearance CLEAR CLEAR   Specific Gravity, Urine 1.014 1.005 - 1.030   pH 6.0 5.0 - 8.0   Glucose, UA NEGATIVE NEGATIVE mg/dL   Hgb urine dipstick NEGATIVE NEGATIVE   Bilirubin Urine NEGATIVE NEGATIVE   Ketones, ur NEGATIVE NEGATIVE mg/dL   Protein, ur  NEGATIVE NEGATIVE mg/dL   Nitrite NEGATIVE NEGATIVE   Leukocytes, UA NEGATIVE NEGATIVE   RBC / HPF 0-5 0 - 5 RBC/hpf   WBC, UA 0-5 0 - 5 WBC/hpf   Bacteria, UA RARE (A) NONE SEEN   Squamous Epithelial / LPF 0-5 (A) NONE SEEN   Mucus PRESENT   Culture, blood (Routine X 2) w Reflex to ID Panel     Status: None (Preliminary result)   Collection Time: 02/07/17 12:48 PM  Result Value Ref Range   Specimen Description RIGHT ANTECUBITAL    Special Requests      BOTTLES DRAWN AEROBIC AND ANAEROBIC Blood Culture adequate volume   Culture NO GROWTH < 24 HOURS    Report Status PENDING   TSH     Status: None   Collection Time: 02/07/17 12:48 PM  Result Value Ref Range   TSH 3.530 0.350 - 4.500 uIU/mL    Comment: Performed by a 3rd Generation assay with a functional sensitivity of <=0.01 uIU/mL.  T4, free     Status: None   Collection Time: 02/07/17 12:48 PM  Result Value Ref Range   Free T4 0.87 0.61 - 1.12 ng/dL    Comment: (NOTE) Biotin ingestion may interfere with free T4 tests. If the results are inconsistent with the TSH level, previous test results, or the clinical presentation, then consider biotin interference. If needed, order repeat testing after stopping biotin. Performed at [facility], [place] [address] [place], [place] [address]   Vitamin B12     Status: None   Collection Time: 02/07/17 12:48 PM  Result Value Ref Range   Vitamin B-12 213 180 - 914 pg/mL    Comment: (NOTE) This assay is not validated for testing neonatal or myeloproliferative syndrome specimens for Vitamin B12 levels. Performed at [facility], [place] [address] [place],  [address]   Ammonia     Status: None   Collection Time: 02/07/17 12:48 PM  Result Value Ref Range   Ammonia 9 9 - 35 umol/L  Culture, blood (Routine X 2) w Reflex to ID Panel     Status: None (Preliminary result)   Collection Time: 02/07/17 12:54 PM  Result Value Ref Range   Specimen Description LEFT ANTECUBITAL     Special Requests      BOTTLES DRAWN AEROBIC AND ANAEROBIC Blood Culture adequate volume   Culture NO GROWTH <  24 HOURS    Report Status PENDING   Lipid panel     Status: Abnormal   Collection Time: 02/08/17  3:57 AM  Result Value Ref Range   Cholesterol 114 0 - 200 mg/dL   Triglycerides 130 <150 mg/dL   HDL 36 (L) >40 mg/dL   Total CHOL/HDL Ratio 3.2 RATIO   VLDL 26 0 - 40 mg/dL   LDL Cholesterol 52 0 - 99 mg/dL    Comment:        Total Cholesterol/HDL:CHD Risk Coronary Heart Disease Risk Table                     Men   Women  1/2 Average Risk   3.4   3.3  Average Risk       5.0   4.4  2 X Average Risk   9.6   7.1  3 X Average Risk  23.4   11.0        Use the calculated Patient Ratio above and the CHD Risk Table to determine the patient's CHD Risk.        ATP III CLASSIFICATION (LDL):  <100     mg/dL   Optimal  100-129  mg/dL   Near or Above                    Optimal  130-159  mg/dL   Borderline  160-189  mg/dL   High  >190     mg/dL   Very High   Lactic acid, plasma     Status: None   Collection Time: 02/08/17  3:57 AM  Result Value Ref Range   Lactic Acid, Venous 1.2 0.5 - 1.9 mmol/L  Basic metabolic panel     Status: Abnormal   Collection Time: 02/08/17  3:57 AM  Result Value Ref Range   Sodium 142 135 - 145 mmol/L   Potassium 3.8 3.5 - 5.1 mmol/L   Chloride 110 101 - 111 mmol/L   CO2 26 22 - 32 mmol/L   Glucose, Bld 93 65 - 99 mg/dL   BUN 18 6 - 20 mg/dL   Creatinine, Ser 1.05 (H) 0.44 - 1.00 mg/dL   Calcium 8.6 (L) 8.9 - 10.3 mg/dL   GFR calc non Af Amer 51 (L) >60 mL/min   GFR calc Af Amer 60 (L) >60 mL/min    Comment: (NOTE) The eGFR has been calculated using the CKD EPI equation. This calculation has not been validated in all clinical situations. eGFR's persistently <60 mL/min signify possible Chronic Kidney Disease.    Anion gap 6 5 - 15    Studies/Results:  BRAIN MRI MRA FINDINGS: MRI HEAD FINDINGS  Brain: No acute infarction, hemorrhage,  hydrocephalus, extra-axial collection or mass lesion. Small chronic lacunar infarction in right anterior putamen extending into frontal periventricular white matter. Tiny chronic lacunar infarct in left thalamus. Nonspecific foci of T2 FLAIR hyperintense signal abnormality in subcortical and periventricular white matter are compatible mild chronic microvascular ischemic changes and there is mild brain parenchymal volume loss.  Vascular: As below.  Skull and upper cervical spine: Normal marrow signal.  Sinuses/Orbits: Mild ethmoid and right maxillary sinus mucosal thickening. No abnormal signal of mastoid air cells. Orbits are unremarkable.  Other: None.  MRA HEAD FINDINGS  Internal carotid arteries:  Patent.  Anterior cerebral arteries:  Patent.  Middle cerebral arteries: Patent.  Anterior communicating artery: Patent.  Posterior communicating arteries: Not identified, likely hypoplastic or absent.  Posterior  cerebral arteries: Patent. Stable left P2 short segment of severe stenosis.  Basilar artery:  Patent.  Vertebral arteries:  Patent.  No additional evidence of high-grade stenosis, large vessel occlusion, or aneurysm.  IMPRESSION: MRI head:  1. No acute intracranial abnormality identified. 2. Chronic right anterior basal ganglia lacunar infarct. 3. Mild chronic microvascular ischemic changes and parenchymal volume loss of the brain. MRI head:  1. 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 2. No new large vessel occlusion, stenosis, or aneurysm identified.   Carotid dopplers fine     TTE Impressions:  - Normal LV wall thickness with LVEF 60-65% and grade 1 diastolic   dysfunction. Trivial mitral regurgitation. Mildly calcified   aortic valve. Mild tricuspid regurgitation with estimated PASP 31   mmHg. No obvious PFO or ASD.     The brain MRI is reviewed in person.  There is no acute finding seen on DWI.  There is  moderate global atrophy.  There is reduced signal seen on T1 involving the right putamen extending to the caudate nuclei indicating a moderate size encephalomalacia likely from a remote infarct.  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  No hemorrhages appreciated.  MRA shows patent intracranial blood vessels without atherosclerotic changes.     Lucas Winograd A. Merlene Laughter, M.D.  Diplomate, Tax adviser of Psychiatry and Neurology ( Neurology). 02/08/2017, 8:49 AM

## 2017-02-08 NOTE — Progress Notes (Signed)
Pt discharge instructions and stroke booklet reviewed with pt and husband and son. Pt and family verb understanding. Pt left via wheelchair in NAD with all belongings.

## 2017-02-08 NOTE — Care Management Obs Status (Signed)
MEDICARE OBSERVATION STATUS NOTIFICATION   Patient Details  Name: Imogene Burnancy Votta MRN: [id number] Date of Birth: [birth year]   Medicare Observation Status Notification Given:  Yes    Malcolm MetroChildress, Nareg Breighner Demske, RN 02/08/2017, 10:07 AM

## 2017-02-09 LAB — URINE CULTURE: Culture: <10000 — AB

## 2017-02-09 LAB — HEMOGLOBIN A1C
Hgb A1c MFr Bld: 5.5 % (ref 4.8–5.6)
MEAN PLASMA GLUCOSE: 111 mg/dL

## 2017-02-12 LAB — CULTURE, BLOOD (ROUTINE X 2)
CULTURE: NO GROWTH
CULTURE: NO GROWTH
Special Requests: ADEQUATE
Special Requests: ADEQUATE

## 2017-02-14 ENCOUNTER — Ambulatory Visit (HOSPITAL_COMMUNITY)
Admit: 2017-02-14 | Discharge: 2017-02-14 | Disposition: A | Discharge status: Home / Self Care | Payer: Medicare HMO | Attending: Neurology | Admitting: Neurology

## 2017-02-14 DIAGNOSIS — R569 Unspecified convulsions: Secondary | ICD-10-CM | POA: Diagnosis not present

## 2017-02-14 DIAGNOSIS — R410 Disorientation, unspecified: Secondary | ICD-10-CM | POA: Diagnosis not present

## 2017-02-14 NOTE — Procedures (Signed)
ELECTROENCEPHALOGRAM REPORT  Date of Study: 02/14/2017  Patient's Name: Lauren Sanders MRN: [id number] Date of Birth: 1945/12/11  Referring Provider: Beryle BeamsKofi Doonquah, MD  Clinical History: 71 year old woman with worsening memory and acute onset of disorientation and confusion  Medications: atorvastatin (LIPITOR) 20 MG tablet  Cholecalciferol (VITAMIN D-3) 5000 UNITS TABS  clopidogrel (PLAVIX) 75 MG tablet  Diclofenac Potassium (CAMBIA) 50 MG PACK  fish oil-omega-3 fatty acids 1000 MG capsule  folic acid (FOLVITE) 400 MCG tablet  lansoprazole (PREVACID) 30 MG capsule  losartan (COZAAR) 50 MG tablet  verapamil (VERELAN PM) 240 MG 24 hr capsule   Technical Summary: A multichannel digital EEG recording measured by the international 10-20 system with electrodes applied with paste and impedances below 5000 ohms performed in our laboratory with EKG monitoring in an awake and drowsy patient.  Hyperventilation was not performed.  Photic stimulation was performed.  The digital EEG was referentially recorded, reformatted, and digitally filtered in a variety of bipolar and referential montages for optimal display.    Description: The patient is awake and drowsy during the recording.  During maximal wakefulness, there is a symmetric, low voltage 12 Hz posterior dominant rhythm that attenuates with eye opening.  The record is symmetric.  Stage 2 sleep is not seen.  Photic stimulation did not elicit any abnormalities.  There were no epileptiform discharges or electrographic seizures seen.    EKG lead was unremarkable.  Impression: This awake and drowsy EEG is normal.    Clinical Correlation: A normal EEG does not exclude a clinical diagnosis of epilepsy.  If further clinical questions remain, prolonged EEG may be helpful.  Clinical correlation is advised.   Shon MilletAdam Adelis Docter, DO

## 2017-02-14 NOTE — Progress Notes (Signed)
EEG completed, results pending. 

## 2017-04-06 ENCOUNTER — Ambulatory Visit: Payer: Medicare HMO | Admitting: Neurology

## 2017-04-06 ENCOUNTER — Encounter: Payer: Self-pay | Admitting: Neurology

## 2017-04-06 VITALS — BP 107/63 | HR 66 | Ht 62.0 in | Wt 115.5 lb

## 2017-04-06 DIAGNOSIS — G454 Transient global amnesia: Secondary | ICD-10-CM | POA: Insufficient documentation

## 2017-04-06 DIAGNOSIS — R404 Transient alteration of awareness: Secondary | ICD-10-CM | POA: Diagnosis not present

## 2017-04-06 NOTE — Progress Notes (Signed)
Reason for visit: Transient amnesia  Referring physician: [facility]  Lauren Sanders is a 72 y.o. 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  The patient has been doing well concerning this, she does report a mild memory problem following her stroke.  The patient was admitted to the hospital on 07 February 2017 with onset of amnesia.  The patient does not recall getting out of bed that morning at 7 AM, she did not regain any recollection of events until 5 PM that day.  The husband who comes with her indicates that she appeared to be otherwise normal, she was not drowsy or staggery, she was not slurring her speech.  She would ask the same questions again and again.  The patient denied any headache.  She reports no residual problems following the above event, she has no numbness or weakness of the extremities, no balance troubles, no problems controlling the bowels of the bladder.  The patient was admitted to the hospital, MRI of the brain was done and showed no acute changes.  An EEG study was normal.  The patient remains on antiplatelet agents.  She is sent to this office for an evaluation.  Past Medical History:  Diagnosis Date  . Arthritis   . Hypercholesteremia   . Migraine with aura   . Rheumatic fever    As a child.  . Stroke (HCC)   . TIA (transient ischemic attack)     Past Surgical History:  Procedure Laterality Date  . ABDOMINAL HYSTERECTOMY    . bladder tacking      History reviewed. No pertinent family history.  Social history:  reports that  has never smoked. she has never used smokeless tobacco. She reports that she does not drink alcohol or use drugs.  Medications:  Prior to Admission medications   Medication Sig Start Date End Date Taking? Authorizing Provider  aspirin 81 MG tablet Take 81 mg by mouth daily.    Yes [provider]  atorvastatin (LIPITOR) 20 MG tablet Take 20 mg by mouth daily.   Yes [provider]  Cholecalciferol (VITAMIN D-3) 5000 UNITS TABS Take 1 tablet by mouth daily.   Yes [provider]  clopidogrel (PLAVIX) 75 MG tablet Take 75 mg by mouth daily.  06/25/12  Yes [provider]  Diclofenac Potassium (CAMBIA) 50 MG PACK Take 1 each by mouth daily as needed. Pain   Yes [provider]  fish oil-omega-3 fatty acids 1000 MG capsule Take 2 g by mouth daily.   Yes [provider]  folic acid (FOLVITE) 400 MCG tablet Take 400 mcg by mouth daily.   Yes [provider]  lansoprazole (PREVACID) 30 MG capsule TAKE ONE CAPSULE EVERY DAY 02/28/13  Yes [name], MD  losartan (COZAAR) 50 MG tablet Take 50 mg by mouth daily.   Yes [provider]  verapamil (VERELAN PM) 240 MG 24 hr capsule Take 240 mg by mouth daily.   Yes [provider]     No Known Allergies  ROS:  Out of a complete 14 system review of symptoms, the patient complains only of the following symptoms, and all other reviewed systems are negative.  Memory loss  Blood pressure 107/63, pulse 66, height 5\' 2"  (1.575 m), weight 115 lb 8 oz (52.4 kg).  Physical Exam  General: The patient is alert and cooperative at the time of the examination.  Eyes: Pupils are equal, round, and reactive to light. Discs are flat bilaterally.  Neck: The neck is supple, no carotid bruits are noted.  Respiratory: The respiratory examination is clear.  Cardiovascular: The cardiovascular examination reveals a regular rate and rhythm, no obvious murmurs or rubs are noted.  Skin: Extremities are without significant edema.  Neurologic Exam  Mental status: The patient is alert and oriented x 3 at the time of the examination. The patient has apparent normal recent and remote memory, with an apparently normal attention span and concentration ability.   Mini-Mental status examination done today shows a total score of 30/30.  Cranial nerves: Facial symmetry is present. There is good sensation of the face to pinprick and soft touch bilaterally. The strength of the facial muscles and the muscles to head turning and shoulder shrug are normal bilaterally. Speech is well enunciated, no aphasia or dysarthria is noted. Extraocular movements are full. Visual fields are full. The tongue is midline, and the patient has symmetric elevation of the soft palate. No obvious hearing deficits are noted.  Motor: The motor testing reveals 5 over 5 strength of all 4 extremities. Good symmetric motor tone is noted throughout.  Sensory: Sensory testing is intact to pinprick, soft touch and vibration sensation on all 4 extremities. No evidence of extinction is noted.  Coordination: Cerebellar testing reveals good finger-nose-finger and heel-to-shin bilaterally.  Gait and station: Gait is normal. Tandem gait is normal. Romberg is negative. No drift is seen.  Reflexes: Deep tendon reflexes are symmetric and normal bilaterally. Toes are downgoing bilaterally.   MRI brain and MRA head 02/07/17:  IMPRESSION: MRI head:  1. No acute intracranial abnormality identified. 2. Chronic right anterior basal ganglia lacunar infarct. 3. Mild chronic microvascular ischemic changes and parenchymal volume loss of the brain. MRI head:  1. 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 2. No new large vessel occlusion, stenosis, or aneurysm identified.  * MRI scan images were reviewed online. I agree with the written report.    Assessment/Plan:  1.  Transient global amnesia  The patient had an event very typical for transient global amnesia.  The patient had no alteration of sensorium during the event with a pure amnestic episode.  Stroke evaluation was negative.  EEG study was unremarkable.  No further workup is indicated.  The patient will remain on antiplatelet agents,  she will follow-up through this office if needed.  Lauren Palau. Keith Arielys Wandersee MD 04/06/2017 1:43 PM  [facility] [address] [place], [place] [id number]  Phone [phone] Fax [phone]

## 2019-07-13 IMAGING — CT CT HEAD CODE STROKE
3 series · 15 of 47 positions shown, 18 images · non-contrast
Comparison: Brain MRI 07/01/2011.  Intracranial MRA 09/13/2011.

CLINICAL DATA: Code stroke. 71-year-old female was normal upon
waking at 9099 hours. Confusion noticed at 1811 hours.

EXAM:
CT HEAD WITHOUT CONTRAST
TECHNIQUE: Contiguous axial images were obtained from the base of the skull
through the vertex without intravenous contrast.

[Series 2: head code stroke wo · axial · 0.41mm/px · z∈[+1630,+1755]mm · 9 of 30 slices shown, 12 images]
[im 3/30  brain]
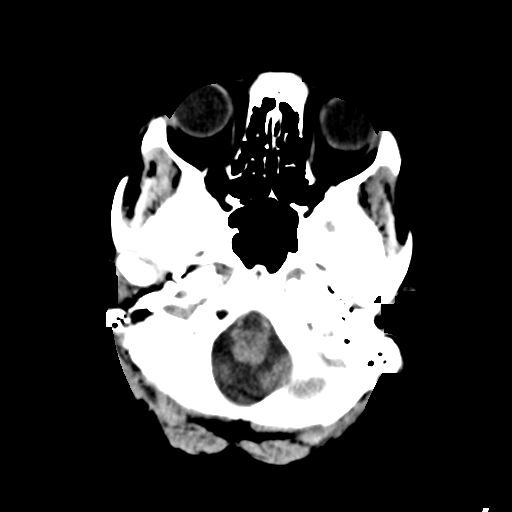
[im 3/30  bone]
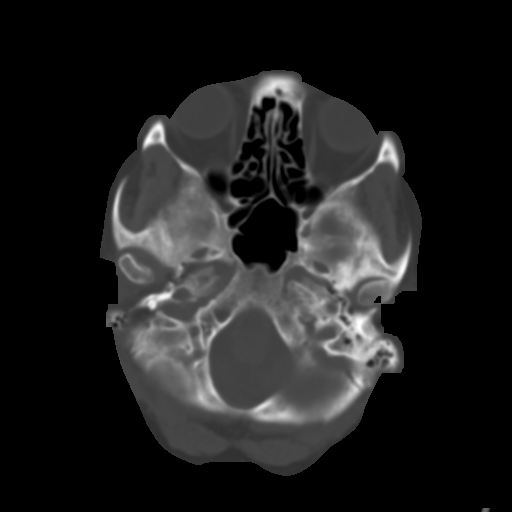
[im 6/30  brain]
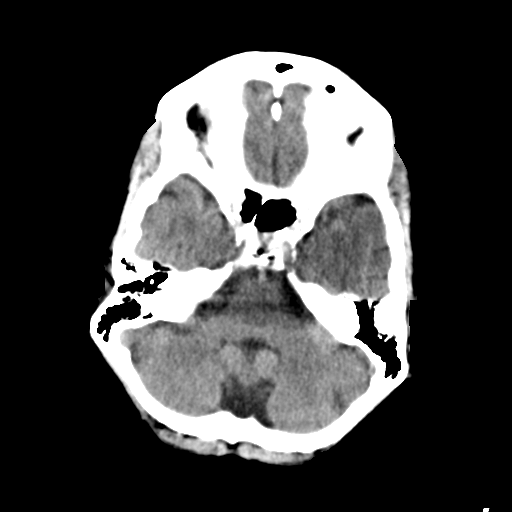
[im 9/30  brain]
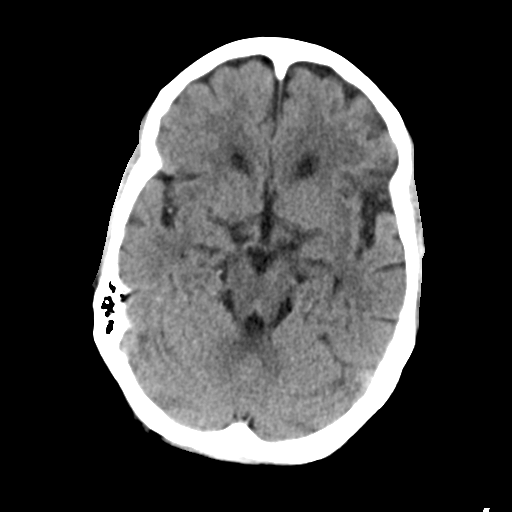
[im 12/30  brain]
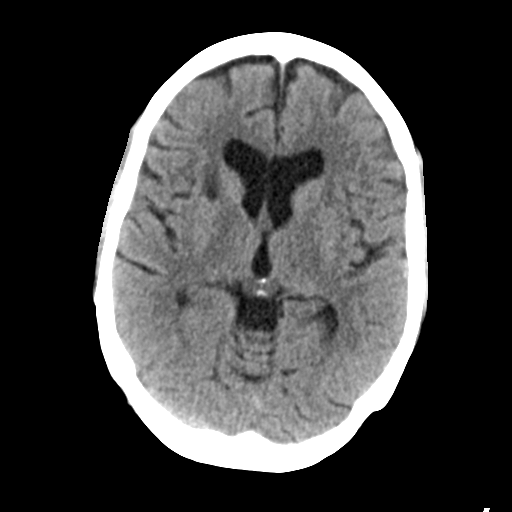
[im 16/30  brain]
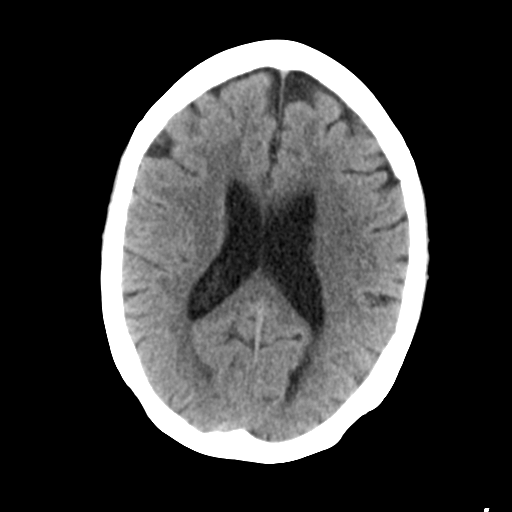
[im 16/30  bone]
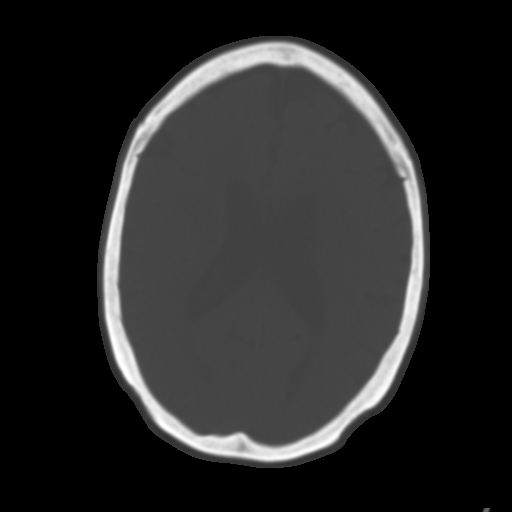
[im 19/30  brain]
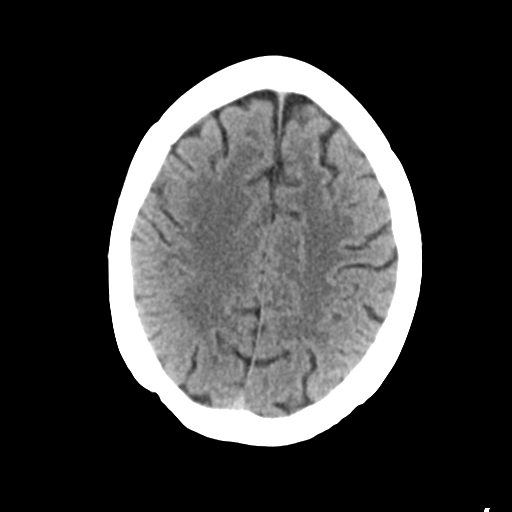
[im 22/30  brain]
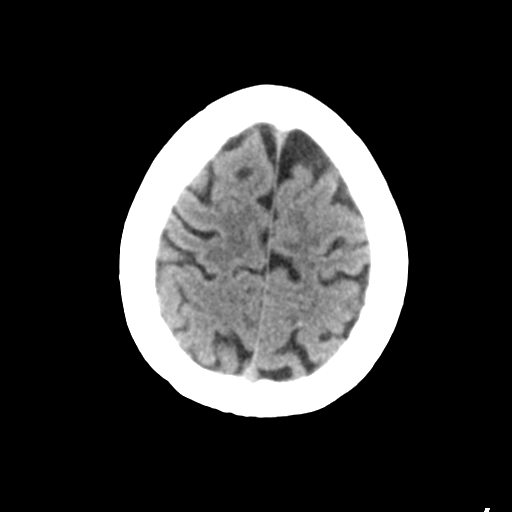
[im 25/30  brain]
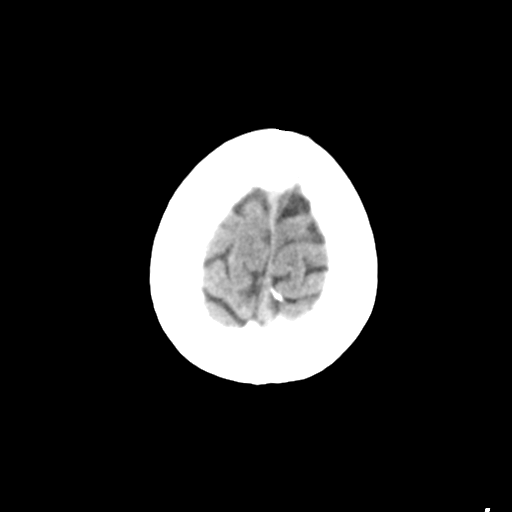
[im 28/30  brain]
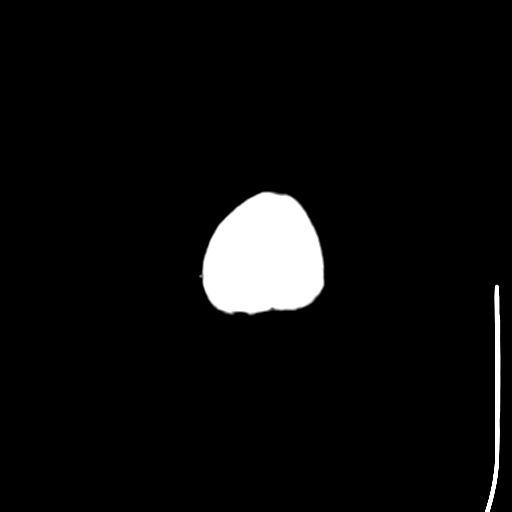
[im 28/30  bone]
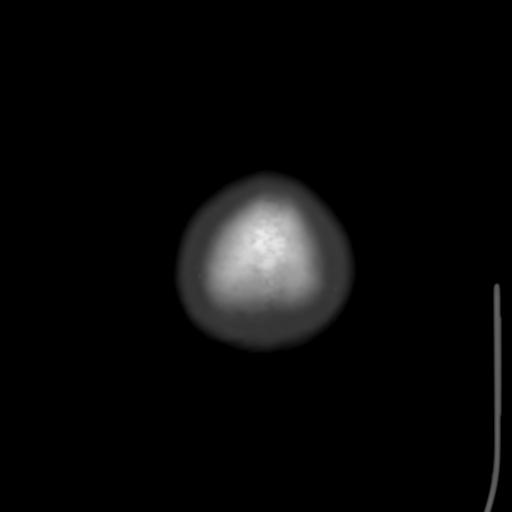

[Series 4: coronal soft tissue · coronal · 0.30mm/px · 3 of 67 slices shown]
[im 23/67  brain]
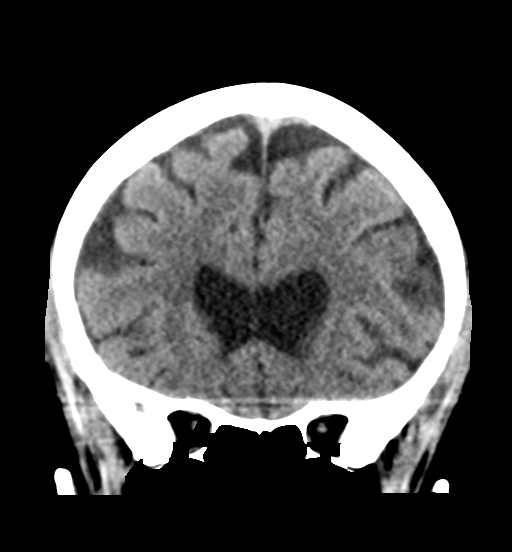
[im 30/67  brain]
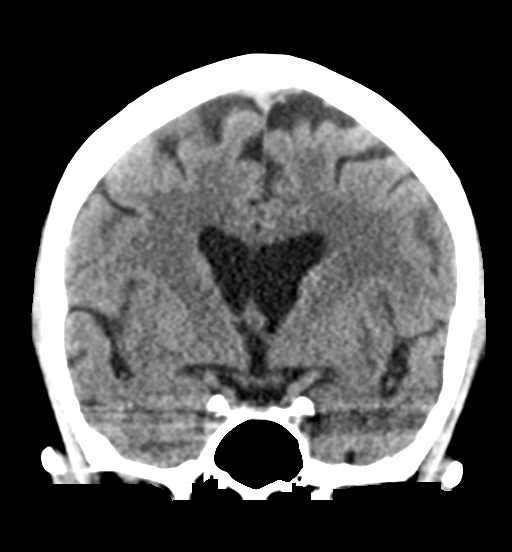
[im 37/67  brain]
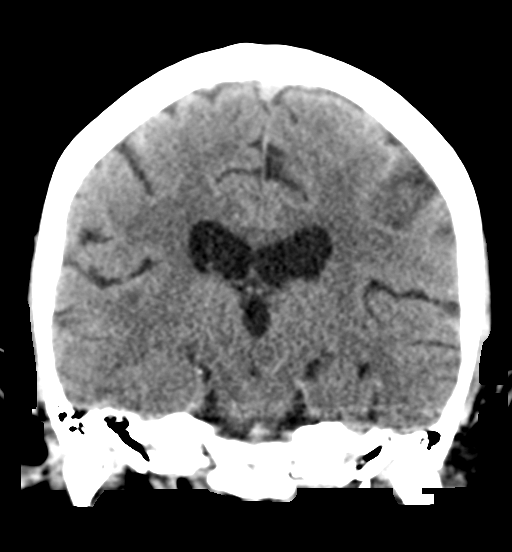

[Series 5: sagittal soft tissue · sagittal · 0.33mm/px · 3 of 53 slices shown]
[im 18/53  brain]
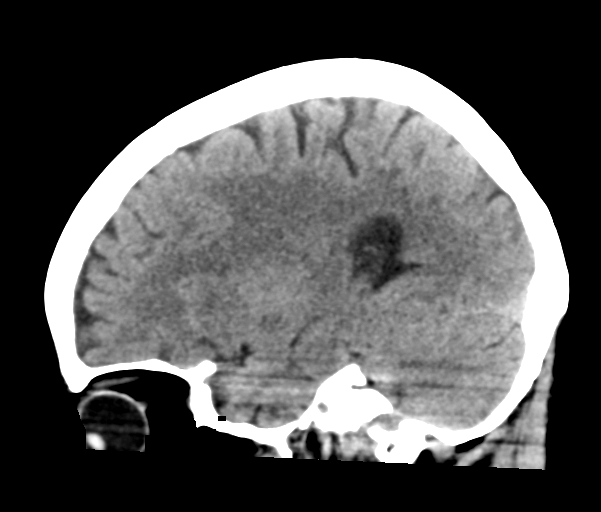
[im 27/53  brain]
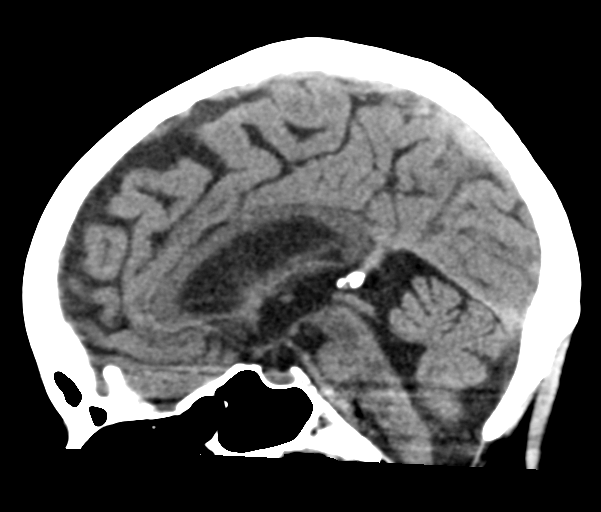
[im 35/53  brain]
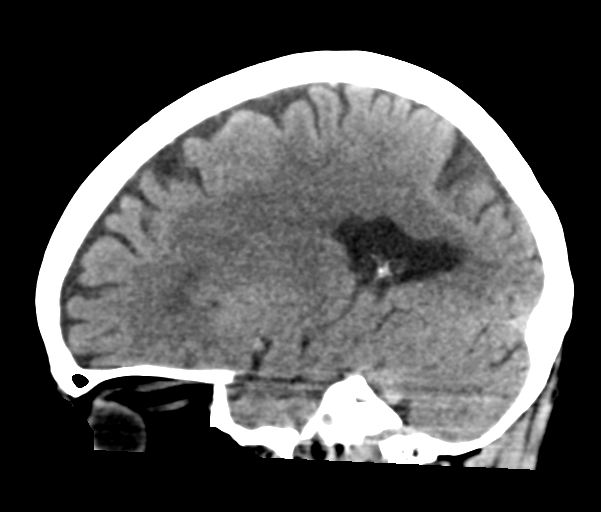

[15 of 47 positions shown; findings below may reference images not displayed]

FINDINGS: Brain: Confluent hypodensity in the right caudate and anterior
lentiform (series 2, image 12) is new since the 1099 comparison.

Stable overall cerebral volume since 1099. Elsewhere Gray-white
matter differentiation is within normal limits throughout the brain.
No midline shift, ventriculomegaly, mass effect, evidence of mass
lesion, intracranial hemorrhage or evidence of cortically based
acute infarction. No cortical encephalomalacia identified.

Vascular: Calcified atherosclerosis at the skull base. No suspicious
intracranial vascular hyperdensity.

Skull: Negative.  No acute osseous abnormality identified.

Sinuses/Orbits: Clear.

Other: Visualized orbits and scalp soft tissues are within normal
limits.

ASPECTS (Alberta Stroke Program Early CT Score)

- Ganglionic level infarction (caudate, lentiform nuclei, internal
capsule, insula, M1-M3 cortex): 7

- Supraganglionic infarction (M4-M6 cortex): 3

Total score (0-10 with 10 being normal): 10
IMPRESSION: 1. Age indeterminate but chronic appearing lacunar infarct of the
right basal ganglia, new since [DATE]. No acute intracranial hemorrhage or cortically based infarct
identified.
3. ASPECTS is 10.
4. Study discussed by telephone with Dr. ILIASE DI MARIA on 02/07/2017

## 2019-09-13 ENCOUNTER — Ambulatory Visit: Payer: Medicare HMO | Admitting: Urology

## 2019-10-11 ENCOUNTER — Ambulatory Visit: Payer: Medicare HMO | Admitting: Urology

## 2024-04-12 ENCOUNTER — Emergency Department (HOSPITAL_COMMUNITY)
Admission: EM | Admit: 2024-04-12 | Discharge: 2024-04-12 | Disposition: A | Discharge status: Home / Self Care | Attending: Emergency Medicine | Admitting: Emergency Medicine

## 2024-04-12 ENCOUNTER — Emergency Department (HOSPITAL_COMMUNITY)

## 2024-04-12 DIAGNOSIS — W19XXXA Unspecified fall, initial encounter: Secondary | ICD-10-CM

## 2024-04-12 NOTE — Discharge Instructions (Signed)
 Exam and imaging were reassuring today.  It is important to monitor for any new symptoms.  If anything concerning arises please return to the emergency department for further evaluation.  Examples of symptoms to monitor for would be sudden weakness, visual changes, severe headaches, difficulties talking/walking, or fainting.  Please follow-up with a primary care provider within the next week for further evaluation and reassessment.

## 2024-04-12 NOTE — ED Provider Notes (Signed)
 " [facility] DE[facility] AT [facility] Provider Note   CSN: [id number] Arrival date & time: 04/12/24  1651     Patient presents with: Fall and Head Injury   Lauren Sanders is a 79 y.o. female.  79 year old female presents emergency department with complaints of head injury secondary to a mechanical fall.  Patient reports she was helping her husband in the bathroom today when he had a syncopal episode causing her to fall into the door.  Patient reports hitting the left temporal area on the door.  Patient was able to get up directly after the fall and denies any significant injuries.  Patient is on Plavix .  Patient denies any loss of consciousness.  She reported that she had a very brief episode of blurry vision that lasted only a few minutes.  She reports she took 2 Tylenol  after the fall for a mild headache that has since resolved.  Patient denies any visual changes since the incident, weakness, difficulty walking or talking, or syncopal episodes.  Patient has significant history of stroke, migraine.  Patient does not have any residual deficits from a stroke.     Prior to Admission medications  Medication Sig Start Date End Date Taking? Authorizing Provider  aspirin  81 MG tablet Take 81 mg by mouth daily.    [provider]  atorvastatin  (LIPITOR) 20 MG tablet Take 20 mg by mouth daily.    [provider]  Cholecalciferol  (VITAMIN D -3) 5000 UNITS TABS Take 1 tablet by mouth daily.    [provider]  clopidogrel  (PLAVIX ) 75 MG tablet Take 75 mg by mouth daily.  06/25/12   [provider]  Diclofenac Potassium (CAMBIA) 50 MG PACK Take 1 each by mouth daily as needed. Pain    [provider]  fish oil-omega-3 fatty acids 1000 MG capsule Take 2 g by mouth daily.    [provider]  folic acid  (FOLVITE ) 400 MCG tablet Take 400 mcg by mouth daily.    [provider]  lansoprazole  (PREVACID ) 30 MG capsule TAKE ONE  CAPSULE EVERY DAY 02/28/13   Jenel Carlin POUR, MD  losartan (COZAAR) 50 MG tablet Take 50 mg by mouth daily.    [provider]  verapamil (VERELAN PM) 240 MG 24 hr capsule Take 240 mg by mouth daily.    [provider]    Allergies: Patient has no known allergies.    Review of Systems  Eyes:  Positive for visual disturbance.  Neurological:  Positive for headaches.  All other systems reviewed and are negative.   Updated Vital Signs BP 135/75   Pulse 66   Temp 98.7 F (37.1 C) (Oral)   Resp 14   SpO2 96%   Physical Exam Vitals and nursing note reviewed.  Constitutional:      General: She is not in acute distress.    Appearance: Normal appearance. She is not ill-appearing or toxic-appearing.  HENT:     Head: Normocephalic and atraumatic.     Comments: No noted hematomas, lacerations, swelling    Nose: Nose normal.  Eyes:     Extraocular Movements: Extraocular movements intact.     Conjunctiva/sclera: Conjunctivae normal.     Pupils: Pupils are equal, round, and reactive to light.     Comments: No visual disturbances, blurred vision, or photophobia.  Pupils equal and reactive bilaterally  Cardiovascular:     Rate and Rhythm: Normal rate.  Pulmonary:     Effort: Pulmonary effort is normal.  No respiratory distress.  Musculoskeletal:        General: Normal range of motion.     Cervical back: Normal range of motion.  Skin:    General: Skin is warm.     Capillary Refill: Capillary refill takes less than 2 seconds.  Neurological:     General: No focal deficit present.     Mental Status: She is alert.     Comments: No deficits on neuroexam.  Psychiatric:        Mood and Affect: Mood normal.        Behavior: Behavior normal.     (all labs ordered are listed, but only abnormal results are displayed) Labs Reviewed - No data to display  EKG: None  Radiology: CT Cervical Spine Wo Contrast Result Date: 04/12/2024 CLINICAL DATA:  Initial evaluation for  acute trauma, fall. EXAM: CT CERVICAL SPINE WITHOUT CONTRAST TECHNIQUE: Multidetector CT imaging of the cervical spine was performed without intravenous contrast. Multiplanar CT image reconstructions were also generated. RADIATION DOSE REDUCTION: This exam was performed according to the departmental dose-optimization program which includes automated exposure control, adjustment of the mA and/or kV according to patient size and/or use of iterative reconstruction technique. COMPARISON:  None Available. FINDINGS: Alignment: Straightening of the normal cervical lordosis. No listhesis or malalignment. Skull base and vertebrae: Skull base intact. Normal C1-2 articulations are preserved and the dens is intact. Vertebral body heights maintained. No acute fracture. Soft tissues and spinal canal: Soft tissues of the neck demonstrate no acute finding. No abnormal prevertebral edema. Spinal canal within normal limits. Disc levels: Moderate degenerative spondylosis at C3-4 through C6-7. Upper chest: Visualized upper chest demonstrates no acute finding. Partially visualized lung apices are clear. Other: None. IMPRESSION: 1. No acute traumatic injury within the cervical spine. 2. 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 Electronically Signed   By: Morene Hoard M.D.   On: 04/12/2024 21:38   CT Head Wo Contrast Result Date: 04/12/2024 CLINICAL DATA:  Initial evaluation for acute head trauma. EXAM: CT HEAD WITHOUT CONTRAST TECHNIQUE: Contiguous axial images were obtained from the base of the skull through the vertex without intravenous contrast. RADIATION DOSE REDUCTION: This exam was performed according to the departmental dose-optimization program which includes automated exposure control, adjustment of the mA and/or kV according to patient size and/or use of iterative reconstruction technique. COMPARISON:  Prior study from 02/07/2017. FINDINGS: Brain: Mild age-related cerebral atrophy. Patchy hypodensity  involving the supratentorial cerebral white matter, consistent with chronic small vessel ischemic disease, mild-to-moderate in nature. Scattered remote lacunar infarcts about the bilateral basal ganglia and left thalamus. No acute intracranial hemorrhage or acute large vessel territory infarct. No mass lesion or midline shift. No hydrocephalus or extra-axial fluid collection. Vascular: No abnormal hyperdense vessel. Scattered vascular calcifications noted within the carotid siphons. Skull: Scalp soft tissues within normal limits.  Calvarium intact. Sinuses/Orbits: Globes and orbital soft tissues within normal limits. Scattered mucosal thickening noted about the ethmoidal air cells. Small air-fluid level noted within the left maxillary sinus. No mastoid effusion. Other: None. IMPRESSION: 1. No acute intracranial abnormality. 2. Mild age-related cerebral atrophy with chronic small vessel ischemic disease, with scattered remote lacunar infarcts about the bilateral basal ganglia and left thalamus. Electronically Signed   By: Morene Hoard M.D.   On: 04/12/2024 21:29     Procedures   Medications Ordered in the ED - No data to display  79 y.o. female presents to the ED with complaints of mechanical fall with associated episode  of blurry vision,The differential diagnosis includes intracranial bleed, cranial fracture, neck fracture, (Ddx)  On arrival pt is nontoxic, vitals unremarkable. Exam significant for mild pain to palpation on the crown of the head.  No signs of injury swelling or laceration.  Imaging Studies ordered:  I ordered imaging studies which included head CT and cervical spine.  No acute traumatic injury or fracture noted.  Cervical spine noted mild degenerative changes with spondylosis at C3 4 through 6-7.  Mild age-related cerebral atrophy and chronic small vessel ischemia old head CT was noted.   ED Course:   Patient sitting comfortably in ED bed on initial evaluation.  Patient  denies any current symptoms.  Patient does endorse some mild pain to palpation on the crown of the head.  CT cervical spine and will be ordered for further evaluation.  No findings on neuroexam.  CT head and cervical spine are unremarkable for acute findings.  Patient denies any new symptoms on reevaluation.  Patient was advised of strict return precautions and advised to follow-up with PCP for reassessment in the next week.  Patient agreed to treatment plan and was comfortable discharge at this time.  Patient was able to ambulate without difficulty or assistance.   Portions of this note were generated with Scientist, clinical (histocompatibility and immunogenetics). Dictation errors may occur despite best attempts at proofreading.   Final diagnoses:  Fall in home, initial encounter    ED Discharge Orders     None          Myriam Fonda RAMAN, [place] 04/12/24 2307  "

## 2024-04-12 NOTE — ED Triage Notes (Signed)
 Pt was helping her husband in the bathroom when her husband was starting to fall. Pt tried to catch husband but she hit her head on the door while she went down. Pt did have blurry vision but has not resolved. A&Ox4.   Pt took 2 tylenol  before coming in but does feel a bump on her head.   Pt is on blood thinners, plavix .  Pt took BP meds today
